# Patient Record
Sex: Female | Born: 1970 | Race: White | Hispanic: No | Marital: Married | Smoking: Current every day smoker
Health system: Southern US, Community
[De-identification: ages and names within clinical notes are randomized; demographics above are authoritative.]

---

## 2011-12-15 ENCOUNTER — Ambulatory Visit: Payer: Self-pay

## 2012-11-05 ENCOUNTER — Ambulatory Visit: Payer: Self-pay | Admitting: Family Medicine

## 2016-03-11 ENCOUNTER — Ambulatory Visit: Payer: Self-pay | Admitting: Internal Medicine

## 2016-03-21 ENCOUNTER — Other Ambulatory Visit: Payer: Self-pay | Admitting: Family Medicine

## 2016-03-21 DIAGNOSIS — N938 Other specified abnormal uterine and vaginal bleeding: Secondary | ICD-10-CM

## 2016-03-26 ENCOUNTER — Ambulatory Visit: Payer: BLUE CROSS/BLUE SHIELD

## 2017-05-06 ENCOUNTER — Ambulatory Visit (INDEPENDENT_AMBULATORY_CARE_PROVIDER_SITE_OTHER): Payer: BLUE CROSS/BLUE SHIELD | Admitting: Podiatry

## 2017-05-06 ENCOUNTER — Encounter: Payer: Self-pay | Admitting: Podiatry

## 2017-05-06 ENCOUNTER — Ambulatory Visit (INDEPENDENT_AMBULATORY_CARE_PROVIDER_SITE_OTHER): Payer: BLUE CROSS/BLUE SHIELD

## 2017-05-06 DIAGNOSIS — B070 Plantar wart: Secondary | ICD-10-CM

## 2017-05-06 DIAGNOSIS — M79671 Pain in right foot: Secondary | ICD-10-CM | POA: Diagnosis not present

## 2017-05-06 NOTE — Progress Notes (Signed)
   Subjective: Patient presents today with burning pain and tenderness on the plantar aspect of the right foot secondary to a lesion beneath the second toe. She admits to picking at the area. She also reports pain on the lateral side of the right foot. Patient states that the pain has been present for several weeks now. Patient denies trauma.  Objective: Physical Exam General: The patient is alert and oriented x3 in no acute distress.  Dermatology: Hyperkeratotic skin lesion noted to the plantar aspect of the right foot approximately 1 cm in diameter. Pinpoint bleeding noted upon debridement. Skin is warm, dry and supple bilateral lower extremities. Negative for open lesions or macerations.  Vascular: Palpable pedal pulses bilaterally. No edema or erythema noted. Capillary refill within normal limits.  Neurological: Epicritic and protective threshold grossly intact bilaterally.   Musculoskeletal Exam: Pain on palpation to the note skin lesion.  Range of motion within normal limits to all pedal and ankle joints bilateral. Muscle strength 5/5 in all groups bilateral.   Radiographic Exam:  Normal osseous mineralization. Joint spaces preserved. No fracture/dislocation/boney destruction.  Assessment: #1 plantar wart right forefoot #2 pain in right foot   Plan of Care:  #1 Patient was evaluated. #2 Excisional debridement of the plantar wart lesion was performed using a chisel blade. Cantharone was applied and the lesion was dressed with a dry sterile dressing. #3 patient is to return to clinic in 2 weeks.     Edrick Kins, DPM [facility]  Dr. Edrick Kins, [address]                                        [place], [place] [address]                Office [phone]  Fax [phone]

## 2017-05-23 ENCOUNTER — Ambulatory Visit (INDEPENDENT_AMBULATORY_CARE_PROVIDER_SITE_OTHER): Payer: BLUE CROSS/BLUE SHIELD | Admitting: Podiatry

## 2017-05-23 ENCOUNTER — Encounter: Payer: Self-pay | Admitting: Podiatry

## 2017-05-23 DIAGNOSIS — B070 Plantar wart: Secondary | ICD-10-CM

## 2017-05-25 NOTE — Progress Notes (Signed)
   Subjective: Patient presents today for follow-up evaluation of a plantar wart to the right forefoot. She states the pain has improved. She only reports pain when applying pressure to the area.  Objective: Physical Exam General: The patient is alert and oriented x3 in no acute distress.  Dermatology: Hyperkeratotic skin lesion noted to the plantar aspect of the right foot approximately 1 cm in diameter. Pinpoint bleeding noted upon debridement. Skin is warm, dry and supple bilateral lower extremities. Negative for open lesions or macerations.  Vascular: Palpable pedal pulses bilaterally. No edema or erythema noted. Capillary refill within normal limits.  Neurological: Epicritic and protective threshold grossly intact bilaterally.   Musculoskeletal Exam: Pain on palpation to the note skin lesion.  Range of motion within normal limits to all pedal and ankle joints bilateral. Muscle strength 5/5 in all groups bilateral.   Assessment: #1 plantar wart right forefoot #2 pain in right foot   Plan of Care:  #1 Patient was evaluated. #2 Excisional debridement of the plantar wart lesion was performed using a chisel blade. Cantharone was applied and the lesion was dressed with a dry sterile dressing. #3 patient is to return to clinic in 2 weeks.     Edrick Kins, DPM [facility]  Dr. Edrick Kins, [address]                                        [place], [place] [address]                Office [phone]  Fax [phone]

## 2017-06-10 ENCOUNTER — Ambulatory Visit: Payer: BLUE CROSS/BLUE SHIELD | Admitting: Podiatry

## 2019-11-08 DIAGNOSIS — J209 Acute bronchitis, unspecified: Secondary | ICD-10-CM | POA: Diagnosis not present

## 2020-03-14 DIAGNOSIS — Z20828 Contact with and (suspected) exposure to other viral communicable diseases: Secondary | ICD-10-CM | POA: Diagnosis not present

## 2020-03-14 DIAGNOSIS — Z03818 Encounter for observation for suspected exposure to other biological agents ruled out: Secondary | ICD-10-CM | POA: Diagnosis not present

## 2020-03-15 DIAGNOSIS — Z20828 Contact with and (suspected) exposure to other viral communicable diseases: Secondary | ICD-10-CM | POA: Diagnosis not present

## 2021-03-06 ENCOUNTER — Other Ambulatory Visit: Payer: Self-pay | Admitting: Internal Medicine

## 2021-03-07 ENCOUNTER — Other Ambulatory Visit: Payer: Self-pay

## 2021-03-07 ENCOUNTER — Ambulatory Visit
Admission: RE | Admit: 2021-03-07 | Discharge: 2021-03-07 | Disposition: A | Discharge status: Home / Self Care | Payer: BC Managed Care – PPO | Source: Ambulatory Visit | Attending: Internal Medicine | Admitting: Internal Medicine

## 2021-03-07 ENCOUNTER — Ambulatory Visit (INDEPENDENT_AMBULATORY_CARE_PROVIDER_SITE_OTHER): Payer: BC Managed Care – PPO | Admitting: Internal Medicine

## 2021-03-07 ENCOUNTER — Encounter: Payer: Self-pay | Admitting: Internal Medicine

## 2021-03-07 ENCOUNTER — Ambulatory Visit
Admission: RE | Admit: 2021-03-07 | Discharge: 2021-03-07 | Disposition: A | Discharge status: Home / Self Care | Payer: BC Managed Care – PPO | Attending: Internal Medicine | Admitting: Internal Medicine

## 2021-03-07 VITALS — BP 120/84 | HR 84 | Temp 98.4°F | Ht 66.75 in | Wt 210.0 lb

## 2021-03-07 DIAGNOSIS — G5601 Carpal tunnel syndrome, right upper limb: Secondary | ICD-10-CM | POA: Diagnosis not present

## 2021-03-07 DIAGNOSIS — M25521 Pain in right elbow: Secondary | ICD-10-CM | POA: Insufficient documentation

## 2021-03-07 DIAGNOSIS — M542 Cervicalgia: Secondary | ICD-10-CM | POA: Diagnosis not present

## 2021-03-07 DIAGNOSIS — M5412 Radiculopathy, cervical region: Secondary | ICD-10-CM

## 2021-03-07 MED ORDER — CYCLOBENZAPRINE HCL 10 MG PO TABS
10.0000 mg | ORAL_TABLET | Freq: Three times a day (TID) | ORAL | 0 refills | Status: DC | PRN
Start: 2021-03-07 — End: 2022-04-12

## 2021-03-07 MED ORDER — MELOXICAM 15 MG PO TABS: 15.0000 mg | ORAL_TABLET | Freq: Every day | ORAL | 0 refills | Status: DC

## 2021-03-07 MED ORDER — PREDNISONE 10 MG PO TABS: ORAL_TABLET | ORAL | 0 refills | Status: AC

## 2021-03-07 NOTE — Patient Instructions (Signed)
Thermacare heat wraps  When you finish the prednisone, start the Meloxicam  Muscle relaxant after work/before bed and when you are not working

## 2021-03-07 NOTE — Progress Notes (Signed)
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 The pain is present in the upper right arm. The quality of the pain is described as burning (numbness with overhead movement and excessive use). The pain is moderate. The pain has been fluctuating since the incident. Associated symptoms include muscle weakness, numbness and tingling. Pertinent negatives include no chest pain. The symptoms are aggravated by movement and lifting. She has tried NSAIDs and heat for the symptoms. The treatment provided mild relief.   Elbow pain - unable to straighten right elbow completely without pain.  No trauma but works overtime as a Emergency planning/management officer.  Tingling in hand - often wakes her from sleep and gets numb with use.  Some loss of grip.    No results found for: CREATININE, BUN, NA, K, CL, CO2 No results found for: CHOL, HDL, LDLCALC, LDLDIRECT, TRIG, CHOLHDL No results found for: TSH No results found for: HGBA1C No results found for: WBC, HGB, HCT, MCV, PLT No results found for: ALT, AST, GGT, ALKPHOS, BILITOT   Review of Systems  Constitutional: Negative for chills, fatigue and fever.  Respiratory: Negative for cough, chest tightness, shortness of breath and wheezing.   Cardiovascular: Negative for chest pain and palpitations.  Genitourinary: Positive for menstrual problem (irregular menses). Negative for dyspareunia and urgency.  Musculoskeletal: Positive for arthralgias and myalgias. Negative for gait problem and joint swelling.  Neurological: Positive for tingling and numbness.    There are no problems to display for this patient.   No Known Allergies  History reviewed. No pertinent surgical history.  Social  History   Tobacco Use  . Smoking status: Current Every Day Smoker    Packs/day: 0.50    Years: 35.00    Pack years: 17.50  . Smokeless tobacco: Never Used  Vaping Use  . Vaping Use: Never used  Substance Use Topics  . Alcohol use: Yes    Comment: occasional  . Drug use: Never     Medication list has been reviewed and updated.  No outpatient medications have been marked as taking for the 03/07/21 encounter (Office Visit) with Glean Hess, MD.    [facility] 2/9 Scores 03/07/2021  PHQ - 2 Score 0  PHQ- 9 Score 2    GAD 7 : Generalized Anxiety Score 03/07/2021  Nervous, Anxious, on Edge 0  Control/stop worrying 0  Worry too much - different things 0  Trouble relaxing 0  Restless 0  Easily annoyed or irritable 1  Afraid - awful might happen 0  Total GAD 7 Score 1    BP Readings from Last 3 Encounters:  03/07/21 120/84    Physical Exam Vitals and nursing note reviewed.  Constitutional:      General: She is not in acute distress.    Appearance: She is well-developed.  HENT:     Head: Normocephalic and atraumatic.  Cardiovascular:     Rate and Rhythm: Normal rate and regular rhythm.     Pulses: Normal pulses.     Heart sounds: No murmur heard.   Pulmonary:     Effort: Pulmonary effort is normal. No respiratory distress.  Musculoskeletal:     Right shoulder: Normal.     Left shoulder: Normal.     Right elbow: Decreased range of motion. Tenderness present in lateral epicondyle.     Right wrist: Tenderness present. No crepitus. Normal range of motion.     Left wrist: No tenderness or crepitus. Normal range of motion.     Cervical back: Spasms and tenderness present. Decreased range of motion.     Right lower leg: No edema.     Left lower leg: No edema.  Lymphadenopathy:     Cervical: No cervical adenopathy.  Skin:    General: Skin is warm and dry.     Findings: No rash.  Neurological:     Mental Status: She is alert and oriented to person, place, and time.   Psychiatric:        Mood and Affect: Mood normal.        Behavior: Behavior normal.     Wt Readings from Last 3 Encounters:  03/07/21 210 lb (95.3 kg)    BP 120/84   Pulse 84   Temp 98.4 F (36.9 C) (Oral)   Ht 5' 6.75" (1.695 m)   Wt 210 lb (95.3 kg)   LMP 02/13/2021 (Approximate)   SpO2 99%   BMI 33.14 kg/m   Assessment and Plan: 1. Cervical radiculopathy Will get films; begin steroid taper followed by Mobic along with flexeril Consider Thermacare heat wraps Call in 10 days to report response - DG Cervical Spine Complete; Future - cyclobenzaprine (FLEXERIL) 10 MG tablet; Take 1 tablet (10 mg total) by mouth 3 (three) times daily as needed for muscle spasms.  Dispense: 90 tablet; Refill: 0 - meloxicam (MOBIC) 15 MG tablet; Take 1 tablet (15 mg total) by mouth daily.  Dispense: 90 tablet; Refill: 0  2. Right elbow pain Due to overuse; should respond to prednisone - predniSONE (DELTASONE) 10 MG tablet; Take 6 on day 1and 2, 5 on day 3 and 4, 4 on day 5 and 6 , 3 on day 7 and 8, 2 on day 9 and 10 and 1 on day 11 and 12 then stop.  Dispense: 42 tablet; Refill: 0  3. Carpal tunnel syndrome of right wrist Monitor for worsening   Partially dictated using Dragon software. Any errors are unintentional.  Halina Maidens, MD [facility]  03/07/2021

## 2021-03-12 ENCOUNTER — Telehealth: Payer: Self-pay

## 2021-03-12 NOTE — Telephone Encounter (Signed)
Called and left VM for patient to call back and discuss her questions. Also, asked if she is still taking prednisone and asked how she is doing.  CM

## 2021-03-12 NOTE — Telephone Encounter (Signed)
Patient has returned call to practice Patient disclosed that they are still taking the prednisone, and are experiencing no side effects from the medication Please contact again when possible

## 2021-03-12 NOTE — Telephone Encounter (Unsigned)
Copied from [place] [id number]. Topic: General - Other >> Mar 12, 2021  9:14 AM Tessa Lerner A wrote: Reason for CRM: Patient would like to be contacted by a member of staff to discuss xray results further  Please contact to advise

## 2021-03-13 NOTE — Telephone Encounter (Signed)
Called patient and spoke with her and she wanted to clarify what was on her xray. Told her due to mer muscle spasms of her neck she does have narrowing at the cervical spine and minimal arthritic changes. She said she read on my chart that there is bone on bone in her neck. Spoke with Dr. Army Melia to confirm and told patient there is no bone on bone. She verbalized understanding. She said she feels like prednisone is helping some. 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

## 2021-03-20 ENCOUNTER — Encounter: Payer: Self-pay | Admitting: Internal Medicine

## 2021-06-03 ENCOUNTER — Other Ambulatory Visit: Payer: Self-pay | Admitting: Internal Medicine

## 2021-06-03 DIAGNOSIS — M5412 Radiculopathy, cervical region: Secondary | ICD-10-CM

## 2021-06-03 NOTE — Telephone Encounter (Signed)
Requested Prescriptions  Pending Prescriptions Disp Refills  . meloxicam (MOBIC) 15 MG tablet [Pharmacy Med Name: MELOXICAM 15MG  TABLETS] 90 tablet 0    Sig: TAKE 1 TABLET(15 MG) BY MOUTH DAILY     Analgesics:  COX2 Inhibitors Failed - 06/03/2021  3:17 AM      Failed - HGB in normal range and within 360 days    No results found for: HGB, HGBKUC, HGBPOCKUC, HGBOTHER, TOTHGB, HGBPLASMA       Failed - Cr in normal range and within 360 days    No results found for: CREATININE, LABCREAU, [place], [place] - Patient is not pregnant      Passed - Valid encounter within last 12 months    Recent Outpatient Visits          2 months ago Cervical radiculopathy   [facility] Glean Hess, MD      Future Appointments            In 1 month Army Melia, Jesse Sans, MD [facility], [facility]

## 2021-07-13 ENCOUNTER — Encounter: Payer: BC Managed Care – PPO | Admitting: Internal Medicine

## 2021-07-18 ENCOUNTER — Encounter: Payer: Self-pay | Admitting: Internal Medicine

## 2021-07-18 ENCOUNTER — Other Ambulatory Visit (HOSPITAL_COMMUNITY)
Admission: RE | Admit: 2021-07-18 | Discharge: 2021-07-18 | Disposition: A | Discharge status: Home / Self Care | Payer: BC Managed Care – PPO | Source: Ambulatory Visit | Attending: Internal Medicine | Admitting: Internal Medicine

## 2021-07-18 ENCOUNTER — Ambulatory Visit (INDEPENDENT_AMBULATORY_CARE_PROVIDER_SITE_OTHER): Payer: BC Managed Care – PPO | Admitting: Internal Medicine

## 2021-07-18 ENCOUNTER — Other Ambulatory Visit: Payer: Self-pay

## 2021-07-18 VITALS — BP 112/78 | HR 65 | Ht 66.75 in | Wt 199.0 lb

## 2021-07-18 DIAGNOSIS — Z1211 Encounter for screening for malignant neoplasm of colon: Secondary | ICD-10-CM

## 2021-07-18 DIAGNOSIS — Z124 Encounter for screening for malignant neoplasm of cervix: Secondary | ICD-10-CM | POA: Diagnosis not present

## 2021-07-18 DIAGNOSIS — Z0000 Encounter for general adult medical examination without abnormal findings: Secondary | ICD-10-CM

## 2021-07-18 DIAGNOSIS — Z1231 Encounter for screening mammogram for malignant neoplasm of breast: Secondary | ICD-10-CM

## 2021-07-18 NOTE — Progress Notes (Signed)
Date:  07/18/2021   Name:  Jessica Robles   DOB:  1971-06-10   MRN:  [id number]   Chief Complaint: Annual Exam (Breast Exam. Pap smear.) Jessica Robles is a 50 y.o. female who presents today for her Complete Annual Exam. She feels well. She reports exercising - walking daily. She reports she is sleeping fairly well. Breast complaints - none.  Mammogram: due DEXA: none Pap smear: due - last about 2015 normal/remote hx of LEEP Colonoscopy: due   There is no immunization history on file for this patient.  HPI  No results found for: CREATININE, BUN, NA, K, CL, CO2 No results found for: CHOL, HDL, LDLCALC, LDLDIRECT, TRIG, CHOLHDL No results found for: TSH No results found for: HGBA1C No results found for: WBC, HGB, HCT, MCV, PLT No results found for: ALT, AST, GGT, ALKPHOS, BILITOT   Review of Systems  Constitutional:  Negative for chills, fatigue and fever.  HENT:  Negative for congestion, hearing loss, tinnitus, trouble swallowing and voice change.   Eyes:  Negative for visual disturbance.  Respiratory:  Negative for cough, chest tightness, shortness of breath and wheezing.   Cardiovascular:  Negative for chest pain, palpitations and leg swelling.  Gastrointestinal:  Negative for abdominal pain, constipation, diarrhea and vomiting.  Endocrine: Negative for polydipsia and polyuria.  Genitourinary:  Negative for dysuria, frequency, genital sores, vaginal bleeding and vaginal discharge.  Musculoskeletal:  Negative for arthralgias, gait problem and joint swelling.  Skin:  Negative for color change and rash.  Neurological:  Negative for dizziness, tremors, light-headedness and headaches.  Hematological:  Negative for adenopathy. Does not bruise/bleed easily.  Psychiatric/Behavioral:  Negative for dysphoric mood and sleep disturbance. The patient is not nervous/anxious.    Patient Active Problem List   Diagnosis Date Noted   Cervical radiculopathy 03/07/2021   Right elbow pain  03/07/2021    No Known Allergies  History reviewed. No pertinent surgical history.  Social History   Tobacco Use   Smoking status: Every Day    Packs/day: 0.50    Years: 36.00    Pack years: 18.00    Types: Cigarettes   Smokeless tobacco: Never  Vaping Use   Vaping Use: Never used  Substance Use Topics   Alcohol use: Yes    Comment: occasional   Drug use: Never     Medication list has been reviewed and updated.  Current Meds  Medication Sig   cyclobenzaprine (FLEXERIL) 10 MG tablet Take 1 tablet (10 mg total) by mouth 3 (three) times daily as needed for muscle spasms.   meloxicam (MOBIC) 15 MG tablet TAKE 1 TABLET(15 MG) BY MOUTH DAILY    PHQ 2/9 Scores 07/18/2021 03/07/2021  PHQ - 2 Score 0 0  PHQ- 9 Score 2 2    GAD 7 : Generalized Anxiety Score 07/18/2021 03/07/2021  Nervous, Anxious, on Edge 0 0  Control/stop worrying 0 0  Worry too much - different things 0 0  Trouble relaxing 0 0  Restless 0 0  Easily annoyed or irritable 1 1  Afraid - awful might happen 0 0  Total GAD 7 Score 1 1  Anxiety Difficulty Not difficult at all -    BP Readings from Last 3 Encounters:  07/18/21 112/78  03/07/21 120/84    Physical Exam Vitals and nursing note reviewed.  Constitutional:      General: She is not in acute distress.    Appearance: She is well-developed.  HENT:  Head: Normocephalic and atraumatic.     Right Ear: Tympanic membrane and ear canal normal.     Left Ear: Tympanic membrane and ear canal normal.     Nose:     Right Sinus: No maxillary sinus tenderness.     Left Sinus: No maxillary sinus tenderness.  Eyes:     General: No scleral icterus.       Right eye: No discharge.        Left eye: No discharge.     Conjunctiva/sclera: Conjunctivae normal.  Neck:     Thyroid: No thyromegaly.     Vascular: No carotid bruit.  Cardiovascular:     Rate and Rhythm: Normal rate and regular rhythm.     Pulses: Normal pulses.     Heart sounds: Normal heart  sounds.  Pulmonary:     Effort: Pulmonary effort is normal. No respiratory distress.     Breath sounds: No wheezing.  Chest:  Breasts:    Right: No mass, nipple discharge, skin change or tenderness.     Left: No mass, nipple discharge, skin change or tenderness.  Abdominal:     General: Bowel sounds are normal.     Palpations: Abdomen is soft.     Tenderness: There is no abdominal tenderness.  Genitourinary:    Labia:        Right: No tenderness, lesion or injury.        Left: No tenderness, lesion or injury.      Vagina: Normal.     Cervix: Normal.     Uterus: Normal.      Adnexa: Right adnexa normal and left adnexa normal.  Musculoskeletal:     Cervical back: Normal range of motion. No erythema.     Right lower leg: No edema.     Left lower leg: No edema.  Lymphadenopathy:     Cervical: No cervical adenopathy.  Skin:    General: Skin is warm and dry.     Findings: No rash.  Neurological:     Mental Status: She is alert and oriented to person, place, and time.     Cranial Nerves: No cranial nerve deficit.     Sensory: No sensory deficit.     Deep Tendon Reflexes: Reflexes are normal and symmetric.  Psychiatric:        Attention and Perception: Attention normal.        Mood and Affect: Mood normal.    Wt Readings from Last 3 Encounters:  07/18/21 199 lb (90.3 kg)  03/07/21 210 lb (95.3 kg)    BP 112/78 (BP Location: Right Arm, Patient Position: Sitting, Cuff Size: Normal)   Pulse 65   Ht 5' 6.75" (1.695 m)   Wt 199 lb (90.3 kg)   LMP 06/15/2021 (Exact Date)   SpO2 97%   BMI 31.40 kg/m   Assessment and Plan: 1. Annual physical exam Exam is normal except for weight. She is losing weight with regular exercise and appropriate dietary changes. - TSH - Lipid panel - Comprehensive metabolic panel - CBC with Differential/Platelet  2. Colon cancer screening - Ambulatory referral to Gastroenterology  3. Encounter for screening for cervical cancer Obtained  today - Cytology - PAP  4. Encounter for screening mammogram for breast cancer At [facility]   Partially dictated using Editor, commissioning. Any errors are unintentional.  Jessica Maidens, MD [facility]  07/18/2021

## 2021-07-19 LAB — CBC WITH DIFFERENTIAL/PLATELET
Basophils Absolute: 0.1 10*3/uL (ref 0.0–0.2)
Basos: 1 %
EOS (ABSOLUTE): 0.2 10*3/uL (ref 0.0–0.4)
Eos: 2 %
Hematocrit: 46 % (ref 34.0–46.6)
Hemoglobin: 15.5 g/dL (ref 11.1–15.9)
Immature Grans (Abs): 0 10*3/uL (ref 0.0–0.1)
Immature Granulocytes: 0 %
Lymphocytes Absolute: 1.8 10*3/uL (ref 0.7–3.1)
Lymphs: 26 %
MCH: 30.7 pg (ref 26.6–33.0)
MCHC: 33.7 g/dL (ref 31.5–35.7)
MCV: 91 fL (ref 79–97)
Monocytes Absolute: 0.6 10*3/uL (ref 0.1–0.9)
Monocytes: 8 %
Neutrophils Absolute: 4.2 10*3/uL (ref 1.4–7.0)
Neutrophils: 63 %
Platelets: 303 10*3/uL (ref 150–450)
RBC: 5.05 x10E6/uL (ref 3.77–5.28)
RDW: 12.9 % (ref 11.7–15.4)
WBC: 6.8 10*3/uL (ref 3.4–10.8)

## 2021-07-19 LAB — LIPID PANEL
Chol/HDL Ratio: 3.9 ratio (ref 0.0–4.4)
Cholesterol, Total: 194 mg/dL (ref 100–199)
HDL: 50 mg/dL (ref >39)
LDL Chol Calc (NIH): 129 mg/dL — ABNORMAL HIGH (ref 0–99)
Triglycerides: 84 mg/dL (ref 0–149)
VLDL Cholesterol Cal: 15 mg/dL (ref 5–40)

## 2021-07-19 LAB — COMPREHENSIVE METABOLIC PANEL
ALT: 16 IU/L (ref 0–32)
AST: 19 IU/L (ref 0–40)
Albumin/Globulin Ratio: 1.9 (ref 1.2–2.2)
Albumin: 4.5 g/dL (ref 3.8–4.8)
Alkaline Phosphatase: 75 IU/L (ref 44–121)
BUN/Creatinine Ratio: 33 — ABNORMAL HIGH (ref 9–23)
BUN: 24 mg/dL (ref 6–24)
Bilirubin Total: 0.3 mg/dL (ref 0.0–1.2)
CO2: 22 mmol/L (ref 20–29)
Calcium: 9.8 mg/dL (ref 8.7–10.2)
Chloride: 104 mmol/L (ref 96–106)
Creatinine, Ser: 0.73 mg/dL (ref 0.57–1.00)
Globulin, Total: 2.4 g/dL (ref 1.5–4.5)
Glucose: 114 mg/dL — ABNORMAL HIGH (ref 65–99)
Potassium: 5 mmol/L (ref 3.5–5.2)
Sodium: 143 mmol/L (ref 134–144)
Total Protein: 6.9 g/dL (ref 6.0–8.5)
eGFR: 100 mL/min/{1.73_m2} (ref >59)

## 2021-07-19 LAB — TSH: TSH: 1.71 u[IU]/mL (ref 0.450–4.500)

## 2021-07-19 LAB — CYTOLOGY - PAP
Comment: NEGATIVE
Diagnosis: NEGATIVE
High risk HPV: NEGATIVE

## 2021-07-23 ENCOUNTER — Other Ambulatory Visit: Payer: Self-pay

## 2021-07-23 MED ORDER — NA SULFATE-K SULFATE-MG SULF 17.5-3.13-1.6 GM/177ML PO SOLN: 1.0000 | ORAL | 0 refills | Status: DC

## 2021-07-31 ENCOUNTER — Inpatient Hospital Stay: Admission: RE | Admit: 2021-07-31 | Payer: BC Managed Care – PPO | Source: Ambulatory Visit

## 2021-08-15 ENCOUNTER — Other Ambulatory Visit: Payer: Self-pay | Admitting: Internal Medicine

## 2021-08-15 DIAGNOSIS — M5412 Radiculopathy, cervical region: Secondary | ICD-10-CM

## 2021-08-17 ENCOUNTER — Encounter
Admission: RE | Disposition: A | Discharge status: Home / Self Care | Payer: Self-pay | Source: Home / Self Care | Attending: Gastroenterology

## 2021-08-17 ENCOUNTER — Encounter: Payer: Self-pay | Admitting: Gastroenterology

## 2021-08-17 ENCOUNTER — Ambulatory Visit: Payer: BC Managed Care – PPO | Admitting: Certified Registered Nurse Anesthetist

## 2021-08-17 ENCOUNTER — Ambulatory Visit
Admission: RE | Admit: 2021-08-17 | Discharge: 2021-08-17 | Disposition: A | Discharge status: Home / Self Care | Payer: BC Managed Care – PPO | Attending: Gastroenterology | Admitting: Gastroenterology

## 2021-08-17 DIAGNOSIS — D124 Benign neoplasm of descending colon: Secondary | ICD-10-CM | POA: Diagnosis not present

## 2021-08-17 DIAGNOSIS — Z791 Long term (current) use of non-steroidal anti-inflammatories (NSAID): Secondary | ICD-10-CM | POA: Diagnosis not present

## 2021-08-17 DIAGNOSIS — Z1211 Encounter for screening for malignant neoplasm of colon: Secondary | ICD-10-CM

## 2021-08-17 DIAGNOSIS — F17210 Nicotine dependence, cigarettes, uncomplicated: Secondary | ICD-10-CM | POA: Insufficient documentation

## 2021-08-17 DIAGNOSIS — D122 Benign neoplasm of ascending colon: Secondary | ICD-10-CM | POA: Diagnosis not present

## 2021-08-17 DIAGNOSIS — K635 Polyp of colon: Secondary | ICD-10-CM

## 2021-08-17 DIAGNOSIS — Z800 Family history of malignant neoplasm of digestive organs: Secondary | ICD-10-CM | POA: Diagnosis not present

## 2021-08-17 HISTORY — PX: COLONOSCOPY: SHX5424

## 2021-08-17 LAB — POCT PREGNANCY, URINE: Preg Test, Ur: NEGATIVE

## 2021-08-17 SURGERY — COLONOSCOPY
Anesthesia: General

## 2021-08-17 MED ORDER — LIDOCAINE HCL (PF) 2 % IJ SOLN
INTRAMUSCULAR | Status: AC
  Filled 2021-08-17: qty 5

## 2021-08-17 MED ORDER — LIDOCAINE HCL (CARDIAC) PF 100 MG/5ML IV SOSY
PREFILLED_SYRINGE | INTRAVENOUS | Status: DC | PRN
  Administered 2021-08-17: 50 mg via INTRAVENOUS

## 2021-08-17 MED ORDER — SODIUM CHLORIDE 0.9 % IV SOLN
INTRAVENOUS | Status: DC
  Administered 2021-08-17: 20 mL/h via INTRAVENOUS

## 2021-08-17 MED ORDER — PROPOFOL 10 MG/ML IV BOLUS
INTRAVENOUS | Status: DC | PRN
  Administered 2021-08-17: 30 mg via INTRAVENOUS
  Administered 2021-08-17: 70 mg via INTRAVENOUS

## 2021-08-17 MED ORDER — PROPOFOL 500 MG/50ML IV EMUL
INTRAVENOUS | Status: DC | PRN
  Administered 2021-08-17: 170 ug/kg/min via INTRAVENOUS

## 2021-08-17 MED ORDER — PROPOFOL 10 MG/ML IV BOLUS
INTRAVENOUS | Status: AC
  Filled 2021-08-17: qty 20

## 2021-08-17 NOTE — H&P (Signed)
Jessica Bellows, MD [address] [place], [place], [place], [address] [address], [place], [place], [place], [address] Phone: [phone]  Fax: [phone]  Primary Care Physician:  Jessica Hess, MD   Pre-Procedure History & Physical: HPI:  Jessica Robles is a 50 y.o. female is here for an colonoscopy.   History reviewed. No pertinent past medical history.  History reviewed. No pertinent surgical history.  Prior to Admission medications   Medication Sig Start Date End Date Taking? Authorizing Provider  cyclobenzaprine (FLEXERIL) 10 MG tablet Take 1 tablet (10 mg total) by mouth 3 (three) times daily as needed for muscle spasms. 03/07/21  Yes Jessica Hess, MD  meloxicam (MOBIC) 15 MG tablet TAKE 1 TABLET(15 MG) BY MOUTH DAILY 06/03/21  Yes Jessica Hess, MD  Na Sulfate-K Sulfate-Mg Sulf (SUPREP BOWEL PREP KIT) 17.5-3.13-1.6 GM/177ML SOLN Take 1 kit by mouth as directed. 07/23/21  Yes Jessica Bellows, MD    Allergies as of 07/23/2021   (No Known Allergies)    Family History  Problem Relation Age of Onset   Cancer Mother        gall bladder with mets   Hypertension Father    Esophageal cancer Sister    Diabetes Maternal Grandmother    Heart disease Maternal Grandmother    Stroke Maternal Grandmother    Cancer Maternal Grandfather     Social History   Socioeconomic History   Marital status: Married    Spouse name: Not on file   Number of children: 2   Years of education: Not on file   Highest education level: Not on file  Occupational History   Not on file  Tobacco Use   Smoking status: Every Day    Packs/day: 0.50    Years: 36.00    Pack years: 18.00    Types: Cigarettes   Smokeless tobacco: Never  Vaping Use   Vaping Use: Never used  Substance and Sexual Activity   Alcohol use: Yes    Comment: occasional   Drug use: Never   Sexual activity: Yes  Other Topics Concern   Not on file  Social History Narrative   Not on file   Social  Determinants of Health   Financial Resource Strain: Not on file  Food Insecurity: Not on file  Transportation Needs: Not on file  Physical Activity: Not on file  Stress: Not on file  Social Connections: Not on file  Intimate Partner Violence: Not on file    Review of Systems: See HPI, otherwise negative ROS  Physical Exam: BP 115/73   Pulse 78   Temp (!) 97.1 F (36.2 C)   Resp 20   Ht _0  (1.676 m)   Wt 88.9 kg   SpO2 99%   BMI 31.64 kg/m  General:   Alert,  pleasant and cooperative in NAD Head:  Normocephalic and atraumatic. Neck:  Supple; no masses or thyromegaly. Lungs:  Clear throughout to auscultation, normal respiratory effort.    Heart:  +S1, +S2, Regular rate and rhythm, No edema. Abdomen:  Soft, nontender and nondistended. Normal bowel sounds, without guarding, and without rebound.   Neurologic:  Alert and  oriented x4;  grossly normal neurologically.  Impression/Plan: Jessica Robles is here for an colonoscopy to be performed for Screening colonoscopy average risk   Risks, benefits, limitations, and alternatives regarding  colonoscopy have been reviewed with the patient.  Questions have been answered.  All parties agreeable.   Jessica Robles  Jessica Males, MD  08/17/2021, 10:11 AM

## 2021-08-17 NOTE — Transfer of Care (Signed)
Immediate Anesthesia Transfer of Care Note  Patient: Jessica Robles  Procedure(s) Performed: COLONOSCOPY  Patient Location: Endoscopy Unit  Anesthesia Type:General  Level of Consciousness: awake, alert  and oriented  Airway & Oxygen Therapy: Patient Spontanous Breathing  Post-op Assessment: Report given to RN and Post -op Vital signs reviewed and stable  Post vital signs: Reviewed and stable  Last Vitals:  Vitals Value Taken Time  BP 112/53 08/17/21 1046  Temp 36.4 C 08/17/21 1044  Pulse 81 08/17/21 1046  Resp 19 08/17/21 1046  SpO2 100 % 08/17/21 1046  Vitals shown include unvalidated device data.  Last Pain:  Vitals:   08/17/21 1044  TempSrc: Temporal  PainSc: 0-No pain         Complications: No notable events documented.

## 2021-08-17 NOTE — Anesthesia Preprocedure Evaluation (Signed)
Anesthesia Evaluation  Patient identified by MRN, date of birth, ID band Patient awake    Reviewed: Allergy & Precautions, NPO status , Patient's Chart, lab work & pertinent test results  History of Anesthesia Complications Negative for: history of anesthetic complications  Airway Mallampati: III  TM Distance: >3 FB Neck ROM: full    Dental  (+) Chipped   Pulmonary neg shortness of breath, Current Smoker and Patient abstained from smoking.,    Pulmonary exam normal        Cardiovascular Exercise Tolerance: Good (-) Past MI negative cardio ROS Normal cardiovascular exam     Neuro/Psych  Neuromuscular disease negative psych ROS   GI/Hepatic negative GI ROS, Neg liver ROS, neg GERD  ,  Endo/Other  negative endocrine ROS  Renal/GU negative Renal ROS  negative genitourinary   Musculoskeletal   Abdominal   Peds  Hematology negative hematology ROS (+)   Anesthesia Other Findings  BMI    Body Mass Index: 31.64 kg/m      Reproductive/Obstetrics negative OB ROS                             Anesthesia Physical Anesthesia Plan  ASA: 2  Anesthesia Plan: General   Post-op Pain Management:    Induction: Intravenous  PONV Risk Score and Plan: Propofol infusion and TIVA  Airway Management Planned: Natural Airway and Nasal Cannula  Additional Equipment:   Intra-op Plan:   Post-operative Plan:   Informed Consent: I have reviewed the patients History and Physical, chart, labs and discussed the procedure including the risks, benefits and alternatives for the proposed anesthesia with the patient or authorized representative who has indicated his/her understanding and acceptance.     Dental Advisory Given  Plan Discussed with: Anesthesiologist, CRNA and Surgeon  Anesthesia Plan Comments: (Patient consented for risks of anesthesia including but not limited to:  - adverse reactions to  medications - risk of airway placement if required - damage to eyes, teeth, lips or other oral mucosa - nerve damage due to positioning  - sore throat or hoarseness - Damage to heart, brain, nerves, lungs, other parts of body or loss of life  Patient voiced understanding.)        Anesthesia Quick Evaluation

## 2021-08-17 NOTE — Op Note (Signed)
[facility] Gastroenterology Patient Name: Jessica Robles Procedure Date: 08/17/2021 10:12 AM MRN: [id number] Account #: [id number] Date of Birth: 08/02/1971 Admit Type: Outpatient Age: 50 Room: [facility] ENDO ROOM 4 Gender: Female Note Status: Finalized Instrument Name: Park Meo [id number] Procedure:             Colonoscopy Indications:           Screening for colorectal malignant neoplasm Providers:             Jonathon Bellows MD, MD Referring MD:          Halina Maidens, MD (Referring MD) Medicines:             Monitored Anesthesia Care Complications:         No immediate complications. Procedure:             Pre-Anesthesia Assessment:                        - Prior to the procedure, a History and Physical was                         performed, and patient medications, allergies and                         sensitivities were reviewed. The patient's tolerance                         of previous anesthesia was reviewed.                        - The risks and benefits of the procedure and the                         sedation options and risks were discussed with the                         patient. All questions were answered and informed                         consent was obtained.                        - ASA Grade Assessment: II - A patient with mild                         systemic disease.                        After obtaining informed consent, the colonoscope was                         passed under direct vision. Throughout the procedure,                         the patient's blood pressure, pulse, and oxygen                         saturations were monitored continuously. The                         Colonoscope was introduced  through the anus and                         advanced to the the cecum, identified by the                         appendiceal orifice. The colonoscopy was performed                         with ease. The patient tolerated the procedure well.                          The quality of the bowel preparation was good. Findings:      The perianal and digital rectal examinations were normal.      A 10 mm polyp was found in the ascending colon. The polyp was sessile.       The polyp was removed with a cold snare. Resection and retrieval were       complete.      A 7 mm polyp was found in the descending colon. The polyp was sessile.       The polyp was removed with a cold snare. Resection and retrieval were       complete.      The exam was otherwise without abnormality on direct and retroflexion       views. Impression:            - One 10 mm polyp in the ascending colon, removed with                         a cold snare. Resected and retrieved.                        - One 7 mm polyp in the descending colon, removed with                         a cold snare. Resected and retrieved.                        - The examination was otherwise normal on direct and                         retroflexion views. Recommendation:        - Discharge patient to home (with escort).                        - Resume previous diet.                        - Continue present medications.                        - Await pathology results.                        - Repeat colonoscopy for surveillance based on                         pathology results. Procedure Code(s):     --- Professional ---  45385, Colonoscopy, flexible; with removal of                         tumor(s), polyp(s), or other lesion(s) by snare                         technique Diagnosis Code(s):     --- Professional ---                        Z12.11, Encounter for screening for malignant neoplasm                         of colon                        K63.5, Polyp of colon CPT copyright 2019 American Medical Association. All rights reserved. The codes documented in this report are preliminary and upon coder review may  be revised to meet current compliance  requirements. Jonathon Bellows, MD Jonathon Bellows MD, MD 08/17/2021 10:42:17 AM This report has been signed electronically. Number of Addenda: 0 Note Initiated On: 08/17/2021 10:12 AM Scope Withdrawal Time: 0 hours 12 minutes 23 seconds  Total Procedure Duration: 0 hours 17 minutes 11 seconds  Estimated Blood Loss:  Estimated blood loss: none.      [facility]

## 2021-08-21 LAB — SURGICAL PATHOLOGY

## 2021-08-21 NOTE — Anesthesia Postprocedure Evaluation (Signed)
Anesthesia Post Note  Patient: Jessica Robles  Procedure(s) Performed: COLONOSCOPY  Patient location during evaluation: Endoscopy Anesthesia Type: General Level of consciousness: awake and alert Pain management: pain level controlled Vital Signs Assessment: post-procedure vital signs reviewed and stable Respiratory status: spontaneous breathing, nonlabored ventilation, respiratory function stable and patient connected to nasal cannula oxygen Cardiovascular status: blood pressure returned to baseline and stable Postop Assessment: no apparent nausea or vomiting Anesthetic complications: no   No notable events documented.   Last Vitals:  Vitals:   08/17/21 1044 08/17/21 1104  BP: (!) 112/53 128/62  Pulse: 80   Resp: 18   Temp: (!) 36.4 C   SpO2: 100%     Last Pain:  Vitals:   08/18/21 1054  TempSrc:   PainSc: 0-No pain                 Precious Haws Jarnell Cordaro

## 2021-08-28 ENCOUNTER — Encounter: Payer: Self-pay | Admitting: Gastroenterology

## 2021-08-28 NOTE — Progress Notes (Signed)
Ok

## 2021-08-30 ENCOUNTER — Encounter: Payer: Self-pay | Admitting: Internal Medicine

## 2021-09-10 ENCOUNTER — Other Ambulatory Visit: Payer: Self-pay

## 2021-09-10 ENCOUNTER — Ambulatory Visit
Admission: RE | Admit: 2021-09-10 | Discharge: 2021-09-10 | Disposition: A | Discharge status: Home / Self Care | Payer: BC Managed Care – PPO | Source: Ambulatory Visit | Attending: Internal Medicine | Admitting: Internal Medicine

## 2021-09-10 DIAGNOSIS — Z1231 Encounter for screening mammogram for malignant neoplasm of breast: Secondary | ICD-10-CM | POA: Diagnosis not present

## 2021-09-13 DIAGNOSIS — S39012A Strain of muscle, fascia and tendon of lower back, initial encounter: Secondary | ICD-10-CM | POA: Diagnosis not present

## 2021-09-13 DIAGNOSIS — M545 Low back pain, unspecified: Secondary | ICD-10-CM | POA: Diagnosis not present

## 2021-09-17 ENCOUNTER — Other Ambulatory Visit: Payer: Self-pay | Admitting: Internal Medicine

## 2021-09-17 ENCOUNTER — Encounter: Payer: Self-pay | Admitting: Internal Medicine

## 2021-09-17 DIAGNOSIS — R928 Other abnormal and inconclusive findings on diagnostic imaging of breast: Secondary | ICD-10-CM

## 2021-09-17 DIAGNOSIS — N6489 Other specified disorders of breast: Secondary | ICD-10-CM

## 2021-09-17 DIAGNOSIS — N6310 Unspecified lump in the right breast, unspecified quadrant: Secondary | ICD-10-CM

## 2021-09-18 NOTE — Progress Notes (Signed)
Spoke to pt she has spoke to the [facility].   KP

## 2021-09-24 ENCOUNTER — Ambulatory Visit
Admission: RE | Admit: 2021-09-24 | Discharge: 2021-09-24 | Disposition: A | Discharge status: Home / Self Care | Payer: BC Managed Care – PPO | Source: Ambulatory Visit | Attending: Internal Medicine | Admitting: Internal Medicine

## 2021-09-24 ENCOUNTER — Other Ambulatory Visit: Payer: Self-pay

## 2021-09-24 DIAGNOSIS — N6489 Other specified disorders of breast: Secondary | ICD-10-CM

## 2021-09-24 DIAGNOSIS — N6310 Unspecified lump in the right breast, unspecified quadrant: Secondary | ICD-10-CM

## 2021-09-24 DIAGNOSIS — R928 Other abnormal and inconclusive findings on diagnostic imaging of breast: Secondary | ICD-10-CM

## 2021-09-24 DIAGNOSIS — R922 Inconclusive mammogram: Secondary | ICD-10-CM | POA: Diagnosis not present

## 2021-09-25 ENCOUNTER — Other Ambulatory Visit: Payer: Self-pay | Admitting: Internal Medicine

## 2021-09-25 DIAGNOSIS — R928 Other abnormal and inconclusive findings on diagnostic imaging of breast: Secondary | ICD-10-CM

## 2021-09-25 DIAGNOSIS — N6310 Unspecified lump in the right breast, unspecified quadrant: Secondary | ICD-10-CM

## 2021-10-04 ENCOUNTER — Ambulatory Visit
Admission: RE | Admit: 2021-10-04 | Discharge: 2021-10-04 | Disposition: A | Discharge status: Home / Self Care | Payer: BC Managed Care – PPO | Source: Ambulatory Visit | Attending: Internal Medicine | Admitting: Internal Medicine

## 2021-10-04 ENCOUNTER — Other Ambulatory Visit: Payer: Self-pay

## 2021-10-04 DIAGNOSIS — C50811 Malignant neoplasm of overlapping sites of right female breast: Secondary | ICD-10-CM | POA: Diagnosis not present

## 2021-10-04 DIAGNOSIS — D241 Benign neoplasm of right breast: Secondary | ICD-10-CM | POA: Diagnosis not present

## 2021-10-04 DIAGNOSIS — R928 Other abnormal and inconclusive findings on diagnostic imaging of breast: Secondary | ICD-10-CM

## 2021-10-04 DIAGNOSIS — N6310 Unspecified lump in the right breast, unspecified quadrant: Secondary | ICD-10-CM

## 2021-10-04 DIAGNOSIS — N6311 Unspecified lump in the right breast, upper outer quadrant: Secondary | ICD-10-CM | POA: Diagnosis not present

## 2021-10-04 DIAGNOSIS — N6011 Diffuse cystic mastopathy of right breast: Secondary | ICD-10-CM | POA: Diagnosis not present

## 2021-10-04 HISTORY — PX: BREAST BIOPSY: SHX20

## 2021-10-05 ENCOUNTER — Telehealth: Payer: BC Managed Care – PPO | Admitting: Physician Assistant

## 2021-10-05 DIAGNOSIS — J069 Acute upper respiratory infection, unspecified: Secondary | ICD-10-CM | POA: Diagnosis not present

## 2021-10-05 LAB — SURGICAL PATHOLOGY

## 2021-10-05 MED ORDER — BENZONATATE 100 MG PO CAPS
100.0000 mg | ORAL_CAPSULE | Freq: Three times a day (TID) | ORAL | 0 refills | Status: DC | PRN
Start: 2021-10-05 — End: 2022-04-12

## 2021-10-05 MED ORDER — LIDOCAINE VISCOUS HCL 2 % MT SOLN: OROMUCOSAL | 0 refills | Status: DC

## 2021-10-05 MED ORDER — PSEUDOEPH-BROMPHEN-DM 30-2-10 MG/5ML PO SYRP
5.0000 mL | ORAL_SOLUTION | Freq: Four times a day (QID) | ORAL | 0 refills | Status: DC | PRN
Start: 2021-10-05 — End: 2022-04-12

## 2021-10-05 MED ORDER — FLUTICASONE PROPIONATE 50 MCG/ACT NA SUSP: 2.0000 | Freq: Every day | NASAL | 0 refills | Status: DC

## 2021-10-05 NOTE — Progress Notes (Signed)
Virtual Visit Consent   Jessica Robles, you are scheduled for a virtual visit with a [facility] provider today.     Just as with appointments in the office, your consent must be obtained to participate.  Your consent will be active for this visit and any virtual visit you may have with one of our providers in the next 365 days.     If you have a MyChart account, a copy of this consent can be sent to you electronically.  All virtual visits are billed to your insurance company just like a traditional visit in the office.    As this is a virtual visit, video technology does not allow for your provider to perform a traditional examination.  This may limit your provider's ability to fully assess your condition.  If your provider identifies any concerns that need to be evaluated in person or the need to arrange testing (such as labs, EKG, etc.), we will make arrangements to do so.     Although advances in technology are sophisticated, we cannot ensure that it will always work on either your end or our end.  If the connection with a video visit is poor, the visit may have to be switched to a telephone visit.  With either a video or telephone visit, we are not always able to ensure that we have a secure connection.     I need to obtain your verbal consent now.   Are you willing to proceed with your visit today?    Jessica Robles has provided verbal consent on 10/05/2021 for a virtual visit (video or telephone).   Mar Daring, PA-C   Date: 10/05/2021 11:52 AM   Virtual Visit via Video Note   I, Mar Daring, connected with  Jessica Robles  ([id number], 1971/03/16) on 10/05/21 at 11:45 AM EDT by a video-enabled telemedicine application and verified that I am speaking with the correct person using two identifiers.  Location: Patient: Virtual Visit Location Patient: Home Provider: Virtual Visit Location Provider: Home Office   I discussed the limitations of evaluation and management by  telemedicine and the availability of in person appointments. The patient expressed understanding and agreed to proceed.    History of Present Illness: Jessica Robles is a 50 y.o. who identifies as a female who was assigned female at birth, and is being seen today for sore throat.  HPI: Sore Throat  This is a new problem. The current episode started yesterday. There has been no fever. Associated symptoms include congestion, coughing and a hoarse voice. Pertinent negatives include no drooling, ear discharge, ear pain or shortness of breath. She has had no exposure to strep or mono. Exposure to: croop; all grandchildren. Treatments tried: advil cold and sinus. The treatment provided no relief.     Problems:  Patient Active Problem List   Diagnosis Date Noted   Cervical radiculopathy 03/07/2021   Right elbow pain 03/07/2021    Allergies: No Known Allergies Medications:  Current Outpatient Medications:    benzonatate (TESSALON) 100 MG capsule, Take 1 capsule (100 mg total) by mouth 3 (three) times daily as needed., Disp: 30 capsule, Rfl: 0   brompheniramine-pseudoephedrine-DM 30-2-10 MG/5ML syrup, Take 5 mLs by mouth 4 (four) times daily as needed., Disp: 120 mL, Rfl: 0   fluticasone (FLONASE) 50 MCG/ACT nasal spray, Place 2 sprays into both nostrils daily., Disp: 16 g, Rfl: 0   lidocaine (XYLOCAINE) 2 % solution, 86m swish and gargle for 15-20  seconds then swallow every 4 hours as needed for throat pain, Disp: 100 mL, Rfl: 0   cyclobenzaprine (FLEXERIL) 10 MG tablet, Take 1 tablet (10 mg total) by mouth 3 (three) times daily as needed for muscle spasms., Disp: 90 tablet, Rfl: 0   meloxicam (MOBIC) 15 MG tablet, TAKE 1 TABLET(15 MG) BY MOUTH DAILY, Disp: 90 tablet, Rfl: 0   Na Sulfate-K Sulfate-Mg Sulf (SUPREP BOWEL PREP KIT) 17.5-3.13-1.6 GM/177ML SOLN, Take 1 kit by mouth as directed., Disp: 354 mL, Rfl: 0  Observations/Objective: Patient is well-developed, well-nourished in no acute distress.   Resting comfortably at home.  Head is normocephalic, atraumatic.  No labored breathing.  Speech is clear and coherent with logical content.  Patient is alert and oriented at baseline.    Assessment and Plan: 1. Viral URI with cough - brompheniramine-pseudoephedrine-DM 30-2-10 MG/5ML syrup; Take 5 mLs by mouth 4 (four) times daily as needed.  Dispense: 120 mL; Refill: 0 - benzonatate (TESSALON) 100 MG capsule; Take 1 capsule (100 mg total) by mouth 3 (three) times daily as needed.  Dispense: 30 capsule; Refill: 0 - fluticasone (FLONASE) 50 MCG/ACT nasal spray; Place 2 sprays into both nostrils daily.  Dispense: 16 g; Refill: 0 - lidocaine (XYLOCAINE) 2 % solution; 58m swish and gargle for 15-20 seconds then swallow every 4 hours as needed for throat pain  Dispense: 100 mL; Refill: 0  - Recommend covid testing - Has had positive croup exposures (grandchildren) - Will treat with Bromfed DM and tessalon for cough - Viscous lidocaine for sore throat - Flonase for nasal congestion - Push fluids - Rest as needed - Seek in person evaluation if symptoms continue or worsen  Follow Up Instructions: I discussed the assessment and treatment plan with the patient. The patient was provided an opportunity to ask questions and all were answered. The patient agreed with the plan and demonstrated an understanding of the instructions.  A copy of instructions were sent to the patient via MyChart unless otherwise noted below.    The patient was advised to call back or seek an in-person evaluation if the symptoms worsen or if the condition fails to improve as anticipated.  Time:  I spent 10 minutes with the patient via telehealth technology discussing the above problems/concerns.    JMar Daring PA-C

## 2021-10-05 NOTE — Patient Instructions (Signed)
Jessica Robles, thank you for joining Jessica Daring, PA-C for today's virtual visit.  While this provider is not your primary care provider (PCP), if your PCP is located in our provider database this encounter information will be shared with them immediately following your visit.  Consent: (Patient) Jessica Robles provided verbal consent for this virtual visit at the beginning of the encounter.  Current Medications:  Current Outpatient Medications:    benzonatate (TESSALON) 100 MG capsule, Take 1 capsule (100 mg total) by mouth 3 (three) times daily as needed., Disp: 30 capsule, Rfl: 0   brompheniramine-pseudoephedrine-DM 30-2-10 MG/5ML syrup, Take 5 mLs by mouth 4 (four) times daily as needed., Disp: 120 mL, Rfl: 0   fluticasone (FLONASE) 50 MCG/ACT nasal spray, Place 2 sprays into both nostrils daily., Disp: 16 g, Rfl: 0   lidocaine (XYLOCAINE) 2 % solution, 21m swish and gargle for 15-20 seconds then swallow every 4 hours as needed for throat pain, Disp: 100 mL, Rfl: 0   cyclobenzaprine (FLEXERIL) 10 MG tablet, Take 1 tablet (10 mg total) by mouth 3 (three) times daily as needed for muscle spasms., Disp: 90 tablet, Rfl: 0   meloxicam (MOBIC) 15 MG tablet, TAKE 1 TABLET(15 MG) BY MOUTH DAILY, Disp: 90 tablet, Rfl: 0   Na Sulfate-K Sulfate-Mg Sulf (SUPREP BOWEL PREP KIT) 17.5-3.13-1.6 GM/177ML SOLN, Take 1 kit by mouth as directed., Disp: 354 mL, Rfl: 0   Medications ordered in this encounter:  Meds ordered this encounter  Medications   brompheniramine-pseudoephedrine-DM 30-2-10 MG/5ML syrup    Sig: Take 5 mLs by mouth 4 (four) times daily as needed.    Dispense:  120 mL    Refill:  0    Order Specific Question:   Supervising Provider    Answer:   MILLER, BRIAN [[id number]]   benzonatate (TESSALON) 100 MG capsule    Sig: Take 1 capsule (100 mg total) by mouth 3 (three) times daily as needed.    Dispense:  30 capsule    Refill:  0    Order Specific Question:   Supervising Provider     Answer:   MILLER, BRIAN [[id number]]   fluticasone (FLONASE) 50 MCG/ACT nasal spray    Sig: Place 2 sprays into both nostrils daily.    Dispense:  16 g    Refill:  0    Order Specific Question:   Supervising Provider    Answer:   MILLER, BRIAN [[id number]]   lidocaine (XYLOCAINE) 2 % solution    Sig: 577mswish and gargle for 15-20 seconds then swallow every 4 hours as needed for throat pain    Dispense:  100 mL    Refill:  0    Order Specific Question:   Supervising Provider    Answer:   MINoemi [id number]]     *If you need refills on other medications prior to your next appointment, please contact your pharmacy*  Follow-Up: Call back or seek an in-person evaluation if the symptoms worsen or if the condition fails to improve as anticipated.  Other Instructions Viral Respiratory Infection A viral respiratory infection is an illness that affects parts of the body that are used for breathing. These include the lungs, nose, and throat. It is caused by a germ called a virus. Some examples of this kind of infection are: A cold. The flu (influenza). A respiratory syncytial virus (RSV) infection. What are the causes? This condition is caused by a virus. It spreads from person to person. You can  get the virus if: You breathe in droplets from someone who is sick. You come in contact with people who are sick. You touch mucus or other fluid from a person who is sick. What are the signs or symptoms? Symptoms of this condition include: A stuffy or runny nose. A sore throat. A cough. Shortness of breath. Trouble breathing. Yellow or green fluid in the nose. Other symptoms may include: A fever. Sweating or chills. Tiredness (fatigue). Achy muscles. A headache. How is this treated? This condition may be treated with: Medicines that treat viruses. Medicines that make it easy to breathe. Medicines that are sprayed into the nose. Acetaminophen or NSAIDs, such as ibuprofen, to treat fever. Follow  these instructions at home: Managing pain and congestion Take over-the-counter and prescription medicines only as told by your doctor. If you have a sore throat, gargle with salt water. Do this 3-4 times a day or as needed. To make salt water, dissolve -1 tsp (3-6 g) of salt in 1 cup (237 mL) of warm water. Make sure that all the salt dissolves. Use nose drops made from salt water. This helps with stuffiness (congestion). It also helps soften the skin around your nose. Take 2 tsp (10 mL) of honey at bedtime to lessen coughing at night. Do not give honey to children who are younger than 48 year old. Drink enough fluid to keep your pee (urine) pale yellow. General instructions  Rest as much as possible. Do not drink alcohol. Do not smoke or use any products that contain nicotine or tobacco. If you need help quitting, ask your doctor. Keep all follow-up visits. How is this prevented?   Get a flu shot every year. Ask your doctor when you should get your flu shot. Do not let other people get your germs. If you are sick: Wash your hands with soap and water often. Wash your hands after you cough or sneeze. Wash hands for at least 20 seconds. If you cannot use soap and water, use hand sanitizer. Cover your mouth when you cough. Cover your nose and mouth when you sneeze. Do not share cups or eating utensils. Clean commonly used objects often. Clean commonly touched surfaces. Stay home from work or school. Avoid contact with people who are sick during cold and flu season. This is in fall and winter. Get help if: Your symptoms last for 10 days or longer. Your symptoms get worse over time. You have very bad pain in your face or forehead. Parts of your jaw or neck get very swollen. You have shortness of breath. Get help right away if: You feel pain or pressure in your chest. You have trouble breathing. You faint or feel like you will faint. You keep vomiting and it gets worse. You feel  confused. These symptoms may be an emergency. Get help right away. Call your local emergency services (911 in the [place] Do not wait to see if the symptoms will go away. Do not drive yourself to the hospital. Summary A viral respiratory infection is an illness that affects parts of the body that are used for breathing. Examples of this illness include a cold, the flu, and a respiratory syncytial virus (RSV) infection. The infection can cause a runny nose, cough, sore throat, and fever. Follow what your doctor tells you about taking medicines, drinking lots of fluid, washing your hands, resting at home, and avoiding people who are sick. This information is not intended to replace advice given to you by your health  care provider. Make sure you discuss any questions you have with your health care provider. Document Revised: 03/08/2021 Document Reviewed: 03/08/2021 Elsevier Patient Education  2022 [employer].    If you have been instructed to have an in-person evaluation today at a local Urgent Care facility, please use the link below. It will take you to a list of all of our available [facility], including address, phone number and hours of operation. Please do not delay care.  [facility]  If you or a family member do not have a primary care provider, use the link below to schedule a visit and establish care. When you choose a [facility] primary care physician or advanced practice provider, you gain a long-term partner in health. Find a Primary Care Provider  Learn more about 's in-office and virtual care options: [facility]

## 2022-02-27 DIAGNOSIS — J019 Acute sinusitis, unspecified: Secondary | ICD-10-CM | POA: Diagnosis not present

## 2022-03-21 DIAGNOSIS — M4722 Other spondylosis with radiculopathy, cervical region: Secondary | ICD-10-CM | POA: Diagnosis not present

## 2022-03-21 DIAGNOSIS — M4692 Unspecified inflammatory spondylopathy, cervical region: Secondary | ICD-10-CM | POA: Diagnosis not present

## 2022-03-21 DIAGNOSIS — M25552 Pain in left hip: Secondary | ICD-10-CM | POA: Diagnosis not present

## 2022-04-12 ENCOUNTER — Other Ambulatory Visit: Payer: Self-pay | Admitting: Internal Medicine

## 2022-04-12 DIAGNOSIS — M5412 Radiculopathy, cervical region: Secondary | ICD-10-CM

## 2022-04-12 MED ORDER — MELOXICAM 15 MG PO TABS: 15.0000 mg | ORAL_TABLET | Freq: Every day | ORAL | 0 refills | Status: DC

## 2022-04-12 MED ORDER — CYCLOBENZAPRINE HCL 10 MG PO TABS: 10.0000 mg | ORAL_TABLET | Freq: Three times a day (TID) | ORAL | 0 refills | Status: DC | PRN

## 2022-08-05 ENCOUNTER — Encounter: Payer: Self-pay | Admitting: Internal Medicine

## 2022-08-05 DIAGNOSIS — R928 Other abnormal and inconclusive findings on diagnostic imaging of breast: Secondary | ICD-10-CM | POA: Insufficient documentation

## 2022-08-06 ENCOUNTER — Ambulatory Visit (INDEPENDENT_AMBULATORY_CARE_PROVIDER_SITE_OTHER): Payer: BC Managed Care – PPO | Admitting: Internal Medicine

## 2022-08-06 ENCOUNTER — Encounter: Payer: Self-pay | Admitting: Internal Medicine

## 2022-08-06 VITALS — BP 122/70 | HR 83 | Ht 66.0 in | Wt 219.4 lb

## 2022-08-06 DIAGNOSIS — E785 Hyperlipidemia, unspecified: Secondary | ICD-10-CM | POA: Diagnosis not present

## 2022-08-06 DIAGNOSIS — M2550 Pain in unspecified joint: Secondary | ICD-10-CM

## 2022-08-06 DIAGNOSIS — R928 Other abnormal and inconclusive findings on diagnostic imaging of breast: Secondary | ICD-10-CM

## 2022-08-06 DIAGNOSIS — Z1159 Encounter for screening for other viral diseases: Secondary | ICD-10-CM

## 2022-08-06 DIAGNOSIS — Z131 Encounter for screening for diabetes mellitus: Secondary | ICD-10-CM | POA: Diagnosis not present

## 2022-08-06 DIAGNOSIS — Z0000 Encounter for general adult medical examination without abnormal findings: Secondary | ICD-10-CM

## 2022-08-06 NOTE — Progress Notes (Signed)
Date:  08/06/2022   Name:  Jessica Robles   DOB:  [birth year]   MRN:  [id number]   Chief Complaint: Annual Exam Jessica Robles is a 51 y.o. female who presents today for her Complete Annual Exam. She feels well. She reports exercising. She reports she is sleeping poorly. Breast complaints - right breast still tender from having clip placed last year.  Mammogram: 09/2021 abnormal right - bx fibroadenoma rec Dx Mammo and [place] in 6 mo. DEXA: none Pap smear: 07/2021 neg/neg Colonoscopy: 08/2021 repeat 3 yrs  Health Maintenance Due  Topic Date Due   Pneumococcal Vaccine 34-3 Years old (1 - PCV) Never done   HIV Screening  Never done   Hepatitis C Screening  Never done   TETANUS/TDAP  Never done   Zoster Vaccines- Shingrix (1 of 2) Never done   INFLUENZA VACCINE  07/16/2022     There is no immunization history on file for this patient.  Hip Pain  There was no injury mechanism. The pain is present in the left hip. The pain is moderate. The pain has been Fluctuating since onset. Pertinent negatives include no numbness or tingling. The symptoms are aggravated by palpation and weight bearing. She has tried NSAIDs for the symptoms. The treatment provided no relief.  Ankle Pain  There was no injury mechanism. The pain is present in the left ankle. The quality of the pain is described as aching. The pain is moderate. Pertinent negatives include no numbness or tingling. She has tried NSAIDs for the symptoms. The treatment provided mild relief.    Lab Results  Component Value Date   NA 143 07/18/2021   K 5.0 07/18/2021   CO2 22 07/18/2021   GLUCOSE 114 (H) 07/18/2021   BUN 24 07/18/2021   CREATININE 0.73 07/18/2021   CALCIUM 9.8 07/18/2021   EGFR 100 07/18/2021   Lab Results  Component Value Date   CHOL 194 07/18/2021   HDL 50 07/18/2021   LDLCALC 129 (H) 07/18/2021   TRIG 84 07/18/2021   CHOLHDL 3.9 07/18/2021   Lab Results  Component Value Date   TSH 1.710 07/18/2021   No  results found for: "HGBA1C" Lab Results  Component Value Date   WBC 6.8 07/18/2021   HGB 15.5 07/18/2021   HCT 46.0 07/18/2021   MCV 91 07/18/2021   PLT 303 07/18/2021   Lab Results  Component Value Date   ALT 16 07/18/2021   AST 19 07/18/2021   ALKPHOS 75 07/18/2021   BILITOT 0.3 07/18/2021   No results found for: "25OHVITD2", "25OHVITD3", "VD25OH"   Review of Systems  Constitutional:  Negative for chills, fatigue and fever.  HENT:  Negative for congestion, hearing loss, tinnitus, trouble swallowing and voice change.   Eyes:  Negative for visual disturbance.  Respiratory:  Negative for cough, chest tightness, shortness of breath and wheezing.   Cardiovascular:  Negative for chest pain, palpitations and leg swelling.  Gastrointestinal:  Negative for abdominal pain, constipation, diarrhea and vomiting.  Endocrine: Negative for polydipsia and polyuria.  Genitourinary:  Negative for dysuria, frequency, genital sores, vaginal bleeding and vaginal discharge.  Musculoskeletal:  Positive for arthralgias (left hip) and back pain. Negative for gait problem and joint swelling.  Skin:  Negative for color change and rash.  Neurological:  Negative for dizziness, tingling, tremors, light-headedness, numbness and headaches.  Hematological:  Negative for adenopathy. Does not bruise/bleed easily.  Psychiatric/Behavioral:  Negative for dysphoric mood and sleep disturbance. The patient is  not nervous/anxious.     Patient Active Problem List   Diagnosis Date Noted   Abnormal mammogram of right breast 08/05/2022   Cervical radiculopathy 03/07/2021   Right elbow pain 03/07/2021    No Known Allergies  Past Surgical History:  Procedure Laterality Date   BREAST BIOPSY Right 10/04/2021   u/s bx, 9:00 "heart" clip-path pending   COLONOSCOPY N/A 08/17/2021   Procedure: COLONOSCOPY;  Surgeon: Jonathon Bellows, MD;  Location: [facility] ENDOSCOPY;  Service: Gastroenterology;  Laterality: N/A;    Social  History   Tobacco Use   Smoking status: Every Day    Packs/day: 0.50    Years: 36.00    Total pack years: 18.00    Types: Cigarettes   Smokeless tobacco: Never  Vaping Use   Vaping Use: Never used  Substance Use Topics   Alcohol use: Yes    Comment: occasional   Drug use: Never     Medication list has been reviewed and updated.  Current Meds  Medication Sig   [DISCONTINUED] Azelastine-Fluticasone 137-50 MCG/ACT SUSP Place 1 spray into both nostrils 2 (two) times daily.   [DISCONTINUED] cyclobenzaprine (FLEXERIL) 10 MG tablet Take 1 tablet (10 mg total) by mouth 3 (three) times daily as needed for muscle spasms.   [DISCONTINUED] fluticasone (FLONASE) 50 MCG/ACT nasal spray Place 2 sprays into both nostrils daily.   [DISCONTINUED] meloxicam (MOBIC) 15 MG tablet Take 1 tablet (15 mg total) by mouth daily.       08/06/2022   10:41 AM 07/18/2021   10:53 AM 03/07/2021    2:48 PM  GAD 7 : Generalized Anxiety Score  Nervous, Anxious, on Edge 0 0 0  Control/stop worrying 0 0 0  Worry too much - different things 0 0 0  Trouble relaxing 0 0 0  Restless 0 0 0  Easily annoyed or irritable 0 1 1  Afraid - awful might happen 0 0 0  Total GAD 7 Score 0 1 1  Anxiety Difficulty Not difficult at all Not difficult at all        08/06/2022   10:40 AM 07/18/2021   10:52 AM 03/07/2021    2:48 PM  Depression screen PHQ 2/9  Decreased Interest 0 0 0  Down, Depressed, Hopeless 0 0 0  PHQ - 2 Score 0 0 0  Altered sleeping _0 Tired, decreased energy 3 0 1  Change in appetite 0 0 0  Feeling bad or failure about yourself  0 0 0  Trouble concentrating 0 0 0  Moving slowly or fidgety/restless 0 0 0  Suicidal thoughts 0 0 0  PHQ-9 Score _1 Difficult doing work/chores Not difficult at all Not difficult at all     BP Readings from Last 3 Encounters:  08/06/22 122/70  08/17/21 128/62  07/18/21 112/78    Physical Exam Vitals and nursing note reviewed.  Constitutional:       General: She is not in acute distress.    Appearance: She is well-developed.  HENT:     Head: Normocephalic and atraumatic.     Right Ear: Tympanic membrane and ear canal normal.     Left Ear: Tympanic membrane and ear canal normal.     Nose:     Right Sinus: No maxillary sinus tenderness.     Left Sinus: No maxillary sinus tenderness.  Eyes:     General: No scleral icterus.       Right eye: No discharge.  Left eye: No discharge.     Conjunctiva/sclera: Conjunctivae normal.  Neck:     Thyroid: No thyromegaly.     Vascular: No carotid bruit.  Cardiovascular:     Rate and Rhythm: Normal rate and regular rhythm.     Pulses: Normal pulses.     Heart sounds: Normal heart sounds.  Pulmonary:     Effort: Pulmonary effort is normal. No respiratory distress.     Breath sounds: No wheezing.  Chest:  Breasts:    Right: No mass, nipple discharge, skin change or tenderness.     Left: No mass, nipple discharge, skin change or tenderness.  Abdominal:     General: Bowel sounds are normal.     Palpations: Abdomen is soft.     Tenderness: There is no abdominal tenderness.  Musculoskeletal:     Right wrist: No swelling or deformity.     Left wrist: No swelling or deformity.     Right hand: Bony tenderness present.     Left hand: Bony tenderness present.     Cervical back: Normal range of motion. No erythema.     Lumbar back: Negative right straight leg raise test and negative left straight leg raise test.     Right hip: Normal.     Left hip: Tenderness present. Decreased range of motion.     Right lower leg: No edema.     Left lower leg: No edema.     Left ankle: Swelling present. Tenderness present over the medial malleolus. Decreased range of motion.     Comments: Esp MCP joints  Lymphadenopathy:     Cervical: No cervical adenopathy.  Skin:    General: Skin is warm and dry.     Findings: No rash.  Neurological:     Mental Status: She is alert and oriented to person, place, and  time.     Cranial Nerves: No cranial nerve deficit.     Sensory: No sensory deficit.     Deep Tendon Reflexes: Reflexes are normal and symmetric.  Psychiatric:        Attention and Perception: Attention normal.        Mood and Affect: Mood normal.     Wt Readings from Last 3 Encounters:  08/06/22 219 lb 6.4 oz (99.5 kg)  08/17/21 196 lb (88.9 kg)  07/18/21 199 lb (90.3 kg)    BP 122/70   Pulse 83   Ht _0  (1.676 m)   Wt 219 lb 6.4 oz (99.5 kg)   SpO2 95%   BMI 35.41 kg/m   Assessment and Plan: 1. Annual physical exam Exam is normal except for weight. Encourage regular exercise and appropriate dietary changes. Pap and CRC screenings up to date. - CBC with Differential/Platelet - Comprehensive metabolic panel  2. Abnormal mammogram of right breast Did not return for 6 mo follow up so will order bilateral DX with [place] - MM DIAG BREAST TOMO BILATERAL - US BREAST LTD UNI RIGHT INC AXILLA - US BREAST LTD UNI LEFT INC AXILLA  3. Screening for diabetes mellitus - Hemoglobin A1c  4. Mild hyperlipidemia - Lipid panel  5. Need for hepatitis C screening test - Hepatitis C antibody  6. Arthralgia of multiple joints Multiple joint complaints - hands, ankles, back with family hx RA If labs negative would refer to Dr. Zigmund Daniel for help with hip and ankle pain. She has seen Ortho -they wanted MRI but not approved by insurance Tried Mobic and other NSAIDS without benefit. -  Rheumatoid factor - Sedimentation rate   Partially dictated using Editor, commissioning. Any errors are unintentional.  Halina Maidens, MD [facility]  08/06/2022

## 2022-08-07 LAB — COMPREHENSIVE METABOLIC PANEL
ALT: 47 IU/L — ABNORMAL HIGH (ref 0–32)
AST: 35 IU/L (ref 0–40)
Albumin/Globulin Ratio: 1.6 (ref 1.2–2.2)
Albumin: 4.4 g/dL (ref 3.8–4.9)
Alkaline Phosphatase: 96 IU/L (ref 44–121)
BUN/Creatinine Ratio: 41 — ABNORMAL HIGH (ref 9–23)
BUN: 28 mg/dL — ABNORMAL HIGH (ref 6–24)
Bilirubin Total: 0.2 mg/dL (ref 0.0–1.2)
CO2: 22 mmol/L (ref 20–29)
Calcium: 9.6 mg/dL (ref 8.7–10.2)
Chloride: 103 mmol/L (ref 96–106)
Creatinine, Ser: 0.68 mg/dL (ref 0.57–1.00)
Globulin, Total: 2.8 g/dL (ref 1.5–4.5)
Glucose: 113 mg/dL — ABNORMAL HIGH (ref 70–99)
Potassium: 5 mmol/L (ref 3.5–5.2)
Sodium: 143 mmol/L (ref 134–144)
Total Protein: 7.2 g/dL (ref 6.0–8.5)
eGFR: 105 mL/min/{1.73_m2} (ref >59)

## 2022-08-07 LAB — CBC WITH DIFFERENTIAL/PLATELET
Basophils Absolute: 0 10*3/uL (ref 0.0–0.2)
Basos: 1 %
EOS (ABSOLUTE): 0.1 10*3/uL (ref 0.0–0.4)
Eos: 2 %
Hematocrit: 42.3 % (ref 34.0–46.6)
Hemoglobin: 14.2 g/dL (ref 11.1–15.9)
Immature Grans (Abs): 0 10*3/uL (ref 0.0–0.1)
Immature Granulocytes: 0 %
Lymphocytes Absolute: 1.9 10*3/uL (ref 0.7–3.1)
Lymphs: 30 %
MCH: 29.7 pg (ref 26.6–33.0)
MCHC: 33.6 g/dL (ref 31.5–35.7)
MCV: 89 fL (ref 79–97)
Monocytes Absolute: 0.5 10*3/uL (ref 0.1–0.9)
Monocytes: 8 %
Neutrophils Absolute: 3.9 10*3/uL (ref 1.4–7.0)
Neutrophils: 59 %
Platelets: 301 10*3/uL (ref 150–450)
RBC: 4.78 x10E6/uL (ref 3.77–5.28)
RDW: 12.8 % (ref 11.7–15.4)
WBC: 6.5 10*3/uL (ref 3.4–10.8)

## 2022-08-07 LAB — LIPID PANEL
Chol/HDL Ratio: 4.3 ratio (ref 0.0–4.4)
Cholesterol, Total: 236 mg/dL — ABNORMAL HIGH (ref 100–199)
HDL: 55 mg/dL (ref >39)
LDL Chol Calc (NIH): 150 mg/dL — ABNORMAL HIGH (ref 0–99)
Triglycerides: 172 mg/dL — ABNORMAL HIGH (ref 0–149)
VLDL Cholesterol Cal: 31 mg/dL (ref 5–40)

## 2022-08-07 LAB — RHEUMATOID FACTOR: Rheumatoid fact SerPl-aCnc: 13.9 IU/mL (ref <14.0)

## 2022-08-07 LAB — HEPATITIS C ANTIBODY: Hep C Virus Ab: NONREACTIVE

## 2022-08-07 LAB — HEMOGLOBIN A1C
Est. average glucose Bld gHb Est-mCnc: 126 mg/dL
Hgb A1c MFr Bld: 6 % — ABNORMAL HIGH (ref 4.8–5.6)

## 2022-08-07 LAB — SEDIMENTATION RATE: Sed Rate: 3 mm/hr (ref 0–40)

## 2022-08-14 ENCOUNTER — Encounter: Payer: Self-pay | Admitting: Internal Medicine

## 2022-08-22 ENCOUNTER — Ambulatory Visit: Payer: BC Managed Care – PPO | Admitting: Family Medicine

## 2022-08-22 ENCOUNTER — Encounter: Payer: Self-pay | Admitting: Family Medicine

## 2022-08-22 ENCOUNTER — Ambulatory Visit
Admission: RE | Admit: 2022-08-22 | Discharge: 2022-08-22 | Disposition: A | Discharge status: Home / Self Care | Payer: BC Managed Care – PPO | Source: Ambulatory Visit | Attending: Family Medicine | Admitting: Family Medicine

## 2022-08-22 ENCOUNTER — Ambulatory Visit
Admission: RE | Admit: 2022-08-22 | Discharge: 2022-08-22 | Disposition: A | Discharge status: Home / Self Care | Payer: BC Managed Care – PPO | Attending: Family Medicine | Admitting: Family Medicine

## 2022-08-22 VITALS — BP 128/88 | HR 78 | Ht 66.0 in | Wt 219.0 lb

## 2022-08-22 DIAGNOSIS — M25552 Pain in left hip: Secondary | ICD-10-CM | POA: Insufficient documentation

## 2022-08-22 DIAGNOSIS — M545 Low back pain, unspecified: Secondary | ICD-10-CM | POA: Diagnosis not present

## 2022-08-22 DIAGNOSIS — M25562 Pain in left knee: Secondary | ICD-10-CM | POA: Diagnosis not present

## 2022-08-22 DIAGNOSIS — M5442 Lumbago with sciatica, left side: Secondary | ICD-10-CM | POA: Diagnosis not present

## 2022-08-22 DIAGNOSIS — G8929 Other chronic pain: Secondary | ICD-10-CM

## 2022-08-22 DIAGNOSIS — M7672 Peroneal tendinitis, left leg: Secondary | ICD-10-CM | POA: Diagnosis not present

## 2022-08-22 MED ORDER — METHOCARBAMOL 750 MG PO TABS: 750.0000 mg | ORAL_TABLET | Freq: Every evening | ORAL | 0 refills | Status: DC | PRN

## 2022-08-22 MED ORDER — GABAPENTIN 300 MG PO CAPS: 300.0000 mg | ORAL_CAPSULE | Freq: Every day | ORAL | 0 refills | Status: DC

## 2022-08-22 MED ORDER — DICLOFENAC SODIUM 75 MG PO TBEC: 75.0000 mg | DELAYED_RELEASE_TABLET | Freq: Two times a day (BID) | ORAL | 0 refills | Status: DC

## 2022-08-22 NOTE — Patient Instructions (Signed)
-   Dose diclofenac twice daily (take with food) - Dose nightly gabapentin, side effect can be drowsiness - Can dose methocarbamol (muscle relaxer) nightly on as-needed basis - Start physical therapy, referral coordinator will contact you for scheduling - Return for follow-up in 6 weeks, contact for questions/concerns between now and then

## 2022-08-22 NOTE — Progress Notes (Signed)
Primary Care / Sports Medicine Office Visit  Patient Information:  Patient ID: Jessica Robles, female DOB: 07-23-1971 Age: 51 y.o. MRN: [id number]   Jessica Robles is a pleasant 51 y.o. 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    Knee Pain    Left knee hurts while standing for hours, for 1 month    Vitals:   08/22/22 1350  BP: 128/88  Pulse: 78  SpO2: 98%   Vitals:   08/22/22 1350  Weight: 219 lb (99.3 kg)  Height: '5\' 6"'$  (1.676 m)   Body mass index is 35.35 kg/m.  No results found.   Independent interpretation of notes and tests performed by another provider:   None  Procedures performed:   None  Pertinent History, Exam, Impression, and Recommendations:   Problem List Items Addressed This Visit       Nervous and Auditory   Chronic bilateral low back pain with left-sided sciatica    Chronic issue with ongoing symptomatology, notes bilateral paresthesias with prolonged standing, works as a Theme park manager, left symptoms are more noticeable and frequent than the contralateral side.  Examination reveals positive straight leg raise on the left, pain with positive Kemps test, tenderness throughout the sacroiliac regions bilaterally and gluteus medius/minimus on the left.  Plan for dedicated x-rays, scheduled diclofenac and gabapentin, as needed methocarbamol, and formal PT.  For recalcitrant symptoms at return in 6 weeks, pending localization of pain, can consider ultrasound-guided local injections versus advanced imaging and referral to pain and spine group.      Relevant Medications   diclofenac (VOLTAREN) 75 MG EC tablet   gabapentin (NEURONTIN) 300 MG capsule   methocarbamol (ROBAXIN) 750 MG tablet   Other Relevant  Orders   DG Lumbar Spine Complete   Ambulatory referral to Physical Therapy     Musculoskeletal and Integument   Greater trochanteric pain syndrome of left lower extremity - Primary    Patient with chronic, roughly 2-year history of left lateral hip pain and groin pain without significant radiation, aggravated with prolonged weightbearing, prolonged sitting, pain with trying to lay on the left side, and walking.  She does have positive straight leg raise, focal tenderness that recreates her symptoms at the greater trochanteric region, additionally positive FADIR.  Plan for scheduled diclofenac, gabapentin for comorbid radicular features, and as needed methocarbamol.  She will start formal physical therapy and maintain close follow-up in 6 weeks for reevaluation.  Suboptimal progress can be addressed with intra-articular and/or greater trochanteric corticosteroid injections under ultrasound guidance.      Relevant Orders   DG Hip Unilat W OR W/O Pelvis 2-3 Views Left   Ambulatory referral to Physical Therapy   Peroneal tendinitis of left lower leg    Left ankle lateral pain and swelling with prolonged weightbearing in the setting of ipsilateral knee and hip symptoms.  Her findings today show focality of tenderness along the peroneal tendon posterior to the lateral malleolus, secondarily to the base of the fifth metatarsal, aggravated by resisted plantarflexion and eversion.  She will be placed on medications as well as PT, recalcitrant symptoms can be addressed with immobilization, further medication management.  Relevant Orders   Ambulatory referral to Physical Therapy     Other   Arthralgia of left knee    Left knee lateral pain ongoing for several weeks in the setting of longstanding ipsilateral left hip and low back pain.  Examination reveals full painless range of motion, tenderness at the lateral joint line, otherwise provocative testing is benign without ligamentous laxity  appreciated.  Findings are most consistent with secondary/compensatory left knee lateral tibiofemoral compartment arthralgia.  Anticipate that this should improve as low back and hip symptoms improve, she will be placed on medication regimen as well as formal PT. I will obtain dedicated x-rays to rule out osteoarthritis component.  We will have the patient return in 6 weeks for reevaluation, suboptimal progress can be addressed with corticosteroid injection.      Relevant Orders   DG Knee Complete 4 Views Left   Ambulatory referral to Physical Therapy     Orders & Medications Meds ordered this encounter  Medications   diclofenac (VOLTAREN) 75 MG EC tablet    Sig: Take 1 tablet (75 mg total) by mouth 2 (two) times daily.    Dispense:  90 tablet    Refill:  0   gabapentin (NEURONTIN) 300 MG capsule    Sig: Take 1 capsule (300 mg total) by mouth at bedtime.    Dispense:  45 capsule    Refill:  0   methocarbamol (ROBAXIN) 750 MG tablet    Sig: Take 1 tablet (750 mg total) by mouth at bedtime as needed for muscle spasms.    Dispense:  30 tablet    Refill:  0   Orders Placed This Encounter  Procedures   DG Lumbar Spine Complete   DG Hip Unilat W OR W/O Pelvis 2-3 Views Left   DG Knee Complete 4 Views Left   Ambulatory referral to Physical Therapy     Return in about 6 weeks (around 10/03/2022).     Montel Culver, MD   Primary Care Sports Medicine [place]

## 2022-08-22 NOTE — Assessment & Plan Note (Signed)
Patient with chronic, roughly 2-year history of left lateral hip pain and groin pain without significant radiation, aggravated with prolonged weightbearing, prolonged sitting, pain with trying to lay on the left side, and walking.  She does have positive straight leg raise, focal tenderness that recreates her symptoms at the greater trochanteric region, additionally positive FADIR.  Plan for scheduled diclofenac, gabapentin for comorbid radicular features, and as needed methocarbamol.  She will start formal physical therapy and maintain close follow-up in 6 weeks for reevaluation.  Suboptimal progress can be addressed with intra-articular and/or greater trochanteric corticosteroid injections under ultrasound guidance.

## 2022-08-22 NOTE — Assessment & Plan Note (Signed)
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  Examination reveals positive straight leg raise on the left, pain with positive Kemps test, tenderness throughout the sacroiliac regions bilaterally and gluteus medius/minimus on the left.  Plan for dedicated x-rays, scheduled diclofenac and gabapentin, as needed methocarbamol, and formal PT.  For recalcitrant symptoms at return in 6 weeks, pending localization of pain, can consider ultrasound-guided local injections versus advanced imaging and referral to pain and spine group.

## 2022-08-22 NOTE — Assessment & Plan Note (Addendum)
Left knee lateral pain ongoing for several weeks in the setting of longstanding ipsilateral left hip and low back pain.  Examination reveals full painless range of motion, tenderness at the lateral joint line, otherwise provocative testing is benign without ligamentous laxity appreciated.  Findings are most consistent with secondary/compensatory left knee lateral tibiofemoral compartment arthralgia.  Anticipate that this should improve as low back and hip symptoms improve, she will be placed on medication regimen as well as formal PT. I will obtain dedicated x-rays to rule out osteoarthritis component.  We will have the patient return in 6 weeks for reevaluation, suboptimal progress can be addressed with corticosteroid injection.

## 2022-08-22 NOTE — Assessment & Plan Note (Signed)
Left ankle lateral pain and swelling with prolonged weightbearing in the setting of ipsilateral knee and hip symptoms.  Her findings today show focality of tenderness along the peroneal tendon posterior to the lateral malleolus, secondarily to the base of the fifth metatarsal, aggravated by resisted plantarflexion and eversion.  She will be placed on medications as well as PT, recalcitrant symptoms can be addressed with immobilization, further medication management.

## 2022-08-29 ENCOUNTER — Ambulatory Visit (INDEPENDENT_AMBULATORY_CARE_PROVIDER_SITE_OTHER): Payer: BC Managed Care – PPO | Admitting: Family Medicine

## 2022-08-29 ENCOUNTER — Ambulatory Visit
Admission: RE | Admit: 2022-08-29 | Discharge: 2022-08-29 | Disposition: A | Discharge status: Home / Self Care | Payer: BC Managed Care – PPO | Attending: Family Medicine | Admitting: Family Medicine

## 2022-08-29 ENCOUNTER — Ambulatory Visit
Admission: RE | Admit: 2022-08-29 | Discharge: 2022-08-29 | Disposition: A | Discharge status: Home / Self Care | Payer: BC Managed Care – PPO | Source: Ambulatory Visit | Attending: Family Medicine | Admitting: Family Medicine

## 2022-08-29 ENCOUNTER — Encounter: Payer: Self-pay | Admitting: Family Medicine

## 2022-08-29 ENCOUNTER — Other Ambulatory Visit: Payer: Self-pay

## 2022-08-29 VITALS — BP 114/82 | HR 78 | Ht 66.0 in | Wt 220.0 lb

## 2022-08-29 DIAGNOSIS — M7672 Peroneal tendinitis, left leg: Secondary | ICD-10-CM

## 2022-08-29 DIAGNOSIS — M25572 Pain in left ankle and joints of left foot: Secondary | ICD-10-CM

## 2022-08-29 DIAGNOSIS — M7989 Other specified soft tissue disorders: Secondary | ICD-10-CM | POA: Diagnosis not present

## 2022-08-29 DIAGNOSIS — M25562 Pain in left knee: Secondary | ICD-10-CM

## 2022-08-29 DIAGNOSIS — M25579 Pain in unspecified ankle and joints of unspecified foot: Secondary | ICD-10-CM | POA: Diagnosis not present

## 2022-08-29 NOTE — Patient Instructions (Signed)
-   Dose diclofenac twice daily scheduled, take with food - Continue nightly gabapentin - Can use muscle relaxer (methocarbamol) on as-needed basis for additional symptom control - Use cam boot at all times while on your feet x1 week - After 1 week, continue cam boot while outside the house, while inside of the house can gradually wean from boot as symptoms allow - After 2 weeks, gradually wean and discontinue cam boot usage - Start home exercises this weekend and focus on slow and steady advance - Maintain follow-up as scheduled in 1 month, contact for questions between now and then

## 2022-08-29 NOTE — Telephone Encounter (Signed)
Please advise 

## 2022-08-29 NOTE — Assessment & Plan Note (Signed)
Chronic condition with worsening symptoms over the past several days, denies any change in activity or injury at onset of worsening.  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  Pain to the lateral lower leg and lateral ankle to midfoot, aggravated with weightbearing, no paresthesias.  Examination shows focality to the peroneal longus and brevis both at the origin and at the insertion, given her persistent symptomatology despite recent initiation of diclofenac, nature of her work, we discussed short-term immobilization in a short leg Cam boot, home-based rehab, and Rx management guidelines.  She will return in 1 month for reevaluation, suboptimal progress can be addressed with advanced imaging, formal PT, modification of medications.

## 2022-08-29 NOTE — Assessment & Plan Note (Signed)
Chronic, noted in the setting of concomitant ipsilateral left hip and left foot/ankle pain.  Fortunately recent x-rays are reassuring from intraoperative standpoint and her examination is otherwise nonfocal at the knee.  Her lateral knee pain most likely represents a sequela of IT band syndrome as well as peroneal tendinopathy proximally. See additional assessment(s) for plan details.

## 2022-08-29 NOTE — Progress Notes (Signed)
     Primary Care / Sports Medicine Office Visit  Patient Information:  Patient ID: Jessica Robles, female DOB: [birth year] Age: 51 y.o. MRN: [id number]   Jessica Robles is a pleasant 51 y.o. female presenting with the following:  Chief Complaint  Patient presents with   Ankle Pain    Left ankle swelling for 4 days, has been off and on for months, radiates up left leg.     Vitals:   08/29/22 1532  BP: 114/82  Pulse: 78  SpO2: 99%   Vitals:   08/29/22 1532  Weight: 220 lb (99.8 kg)  Height: '5\' 6"'$  (1.676 m)   Body mass index is 35.51 kg/m.     Independent interpretation of notes and tests performed by another provider:   Independent interpretation of left knee x-ray dated 08/24/2022 reveals maintained joint space throughout, no acute osseous abnormalities.  Independent interpretation of the left foot and ankle x-rays dated 08/29/2022 reveals mild/subtle degenerative changes at both the lateral and medial malleoli inferiorly, there is trace evidence of insertional Achilles enthesophyte and calcaneal spur, there is mild degenerative changes at the articulation between the base of the fifth metatarsal and the cuboid, no acute osseous processes noted.  Procedures performed:   None  Pertinent History, Exam, Impression, and Recommendations:   Problem List Items Addressed This Visit       Musculoskeletal and Integument   Peroneal tendinitis of left lower leg    Chronic condition with worsening symptoms over the past several days, denies any change in activity or injury at onset of worsening.  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  Pain to the lateral lower leg and lateral ankle to midfoot, aggravated with weightbearing, no paresthesias.  Examination shows focality to the peroneal longus and brevis both at the origin and at the insertion, given her persistent symptomatology despite recent initiation of diclofenac, nature of her work, we discussed  short-term immobilization in a short leg Cam boot, home-based rehab, and Rx management guidelines.  She will return in 1 month for reevaluation, suboptimal progress can be addressed with advanced imaging, formal PT, modification of medications.        Other   Arthralgia of left knee    Chronic, noted in the setting of concomitant ipsilateral left hip and left foot/ankle pain.  Fortunately recent x-rays are reassuring from intraoperative standpoint and her examination is otherwise nonfocal at the knee.  Her lateral knee pain most likely represents a sequela of IT band syndrome as well as peroneal tendinopathy proximally. See additional assessment(s) for plan details.      Other Visit Diagnoses     Peroneal tendinitis, left    -  Primary        Orders & Medications No orders of the defined types were placed in this encounter.  No orders of the defined types were placed in this encounter.    No follow-ups on file.     Montel Culver, MD   Primary Care Sports Medicine [place]

## 2022-09-11 ENCOUNTER — Ambulatory Visit
Admission: RE | Admit: 2022-09-11 | Discharge: 2022-09-11 | Disposition: A | Discharge status: Home / Self Care | Payer: BC Managed Care – PPO | Source: Ambulatory Visit | Attending: Internal Medicine | Admitting: Internal Medicine

## 2022-09-11 DIAGNOSIS — N6315 Unspecified lump in the right breast, overlapping quadrants: Secondary | ICD-10-CM | POA: Diagnosis not present

## 2022-09-11 DIAGNOSIS — R928 Other abnormal and inconclusive findings on diagnostic imaging of breast: Secondary | ICD-10-CM | POA: Insufficient documentation

## 2022-09-16 ENCOUNTER — Encounter: Payer: Self-pay | Admitting: Family Medicine

## 2022-09-17 ENCOUNTER — Ambulatory Visit: Payer: BC Managed Care – PPO | Admitting: Physical Therapy

## 2022-09-24 ENCOUNTER — Encounter: Payer: BC Managed Care – PPO | Admitting: Physical Therapy

## 2022-09-24 ENCOUNTER — Other Ambulatory Visit: Payer: Self-pay | Admitting: Family Medicine

## 2022-09-25 MED ORDER — METHOCARBAMOL 750 MG PO TABS: 750.0000 mg | ORAL_TABLET | Freq: Every evening | ORAL | 0 refills | Status: DC | PRN

## 2022-09-25 NOTE — Telephone Encounter (Signed)
Refill

## 2022-09-26 ENCOUNTER — Ambulatory Visit: Payer: Self-pay | Admitting: *Deleted

## 2022-09-26 NOTE — Telephone Encounter (Signed)
  Chief Complaint: rectal bleeding on and off since August.  History of hemorrhoids  Symptoms: Rectal bleeding with BMs.   Abd cramping when she needs to go.   Has problems with constipation so pushes to have a BM.    Mild dizziness when she stands up.   Frequency: Intermittently since Aug.   Happened in Sept and over this past weekend. Pertinent Negatives: Patient denies passing out.    Disposition: '[]'$ ED /'[]'$ Urgent Care (no appt availability in office) / '[x]'$ Appointment(In office/virtual)/ '[]'$  [facility]/ '[]'$ Home Care/ '[]'$ Refused Recommended Disposition /'[]'$ [facility]/ '[]'$  Follow-up with PCP Additional Notes: Appt made with Dr. Army Melia for 10/04/2022 at 3:40 with instructions to go to the ED if her symptoms become worse or she becomes lightheaded.   She verbalized understanding and agreement.      I put her on the wait list in hopes of getting her in sooner.

## 2022-09-26 NOTE — Telephone Encounter (Signed)
Called pt to schedule her a sooner appt. Offer pt an appt for tomorrow 10/13. Offered same day appointment slots for 10/16. Pt declined. Pt stated she can not come in until 10/20. Pt stated " I am not bleeding everyday."  KP

## 2022-09-26 NOTE — Telephone Encounter (Signed)
Reason for Disposition  MODERATE rectal bleeding (small blood clots, passing blood without stool, or toilet water turns red)  Answer Assessment - Initial Assessment Questions 1. APPEARANCE of BLOOD: "What color is it?" "Is it passed separately, on the surface of the stool, or mixed in with the stool?"      Having bright red rectal bleeding.   Aug. And Sept and today it's happened.   Over the past weekend my stomach was hurting like needed to have a BM.   Constipated but now it feels like I'm constipated but when U gave a BM it's bright red and loose.    2. AMOUNT: "How much blood was passed?"      The toilet water is very bright red.    3. FREQUENCY: "How many times has blood been passed with the stools?"      Aug once, Sept once and today and over the weekend. 4. ONSET: "When was the blood first seen in the stools?" (Days or weeks)      August 2023 5. DIARRHEA: "Is there also some diarrhea?" If Yes, ask: "How many diarrhea stools in the past 24 hours?"      Yes my stools are loose even though I have a history of constipation. 6. CONSTIPATION: "Do you have constipation?" If Yes, ask: "How bad is it?"     Sometimes. 7. RECURRENT SYMPTOMS: "Have you had blood in your stools before?" If Yes, ask: "When was the last time?" and "What happened that time?"      Yes 8. BLOOD THINNERS: "Do you take any blood thinners?" (e.g., Coumadin/warfarin, Pradaxa/dabigatran, aspirin)     No 9. OTHER SYMPTOMS: "Do you have any other symptoms?"  (e.g., abdomen pain, vomiting, dizziness, fever)     It cramps up like my stomach and I need to go bad.    10. PREGNANCY: "Is there any chance you are pregnant?" "When was your last menstrual period?"       Not asked  Protocols used: Rectal Bleeding-A-AH

## 2022-09-30 ENCOUNTER — Ambulatory Visit (INDEPENDENT_AMBULATORY_CARE_PROVIDER_SITE_OTHER): Payer: BC Managed Care – PPO | Admitting: Internal Medicine

## 2022-09-30 ENCOUNTER — Encounter: Payer: Self-pay | Admitting: Internal Medicine

## 2022-09-30 VITALS — BP 126/76 | HR 76 | Ht 66.0 in | Wt 218.0 lb

## 2022-09-30 DIAGNOSIS — K5904 Chronic idiopathic constipation: Secondary | ICD-10-CM | POA: Insufficient documentation

## 2022-09-30 NOTE — Patient Instructions (Signed)
Probiotic for the next month - longer if desired

## 2022-09-30 NOTE — Progress Notes (Signed)
Date:  09/30/2022   Name:  Jessica Robles   DOB:  [birth year]   MRN:  [id number]   Chief Complaint: Constipation (Started in August of this year. Told PCP about this on physical . Intermittent. Happens at least once a month. Red toilet water, blood on stool. Never had this until she had a colonoscopy. Constipation until she has diarrhea. )  Constipation This is a chronic (probably life long but somewhat worse over the past year) problem. The stool is described as firm and blood tinged. The patient is on a high fiber diet. She Exercises regularly. There has Been adequate water intake. Associated symptoms include abdominal pain (lower abdominal discomfort) and diarrhea (often after passing hard stool). Pertinent negatives include no fecal incontinence, hematochezia or melena. Treatments tried: currently taking Miralax daily and sometimes Dulcolax.  Rectal Bleeding  Episode onset: once a month since August. The problem has been unchanged. The patient is experiencing no pain. The stool is described as hard. Prior unsuccessful therapies include stool softeners and laxatives. Associated symptoms include abdominal pain (lower abdominal discomfort) and diarrhea (often after passing hard stool). Pertinent negatives include no chest pain.    Lab Results  Component Value Date   NA 143 08/06/2022   K 5.0 08/06/2022   CO2 22 08/06/2022   GLUCOSE 113 (H) 08/06/2022   BUN 28 (H) 08/06/2022   CREATININE 0.68 08/06/2022   CALCIUM 9.6 08/06/2022   EGFR 105 08/06/2022   Lab Results  Component Value Date   CHOL 236 (H) 08/06/2022   HDL 55 08/06/2022   LDLCALC 150 (H) 08/06/2022   TRIG 172 (H) 08/06/2022   CHOLHDL 4.3 08/06/2022   Lab Results  Component Value Date   TSH 1.710 07/18/2021   Lab Results  Component Value Date   HGBA1C 6.0 (H) 08/06/2022   Lab Results  Component Value Date   WBC 6.5 08/06/2022   HGB 14.2 08/06/2022   HCT 42.3 08/06/2022   MCV 89 08/06/2022   PLT 301 08/06/2022    Lab Results  Component Value Date   ALT 47 (H) 08/06/2022   AST 35 08/06/2022   ALKPHOS 96 08/06/2022   BILITOT 0.2 08/06/2022   No results found for: "25OHVITD2", "25OHVITD3", "VD25OH"   Review of Systems  Constitutional:  Negative for chills and fatigue.  Respiratory:  Negative for chest tightness and shortness of breath.   Cardiovascular:  Negative for chest pain.  Gastrointestinal:  Positive for abdominal pain (lower abdominal discomfort), constipation and diarrhea (often after passing hard stool). Negative for hematochezia and melena.  Psychiatric/Behavioral:  Negative for dysphoric mood and sleep disturbance. The patient is not nervous/anxious.     Patient Active Problem List   Diagnosis Date Noted   Greater trochanteric pain syndrome of left lower extremity 08/22/2022   Chronic bilateral low back pain with left-sided sciatica 08/22/2022   Arthralgia of left knee 08/22/2022   Peroneal tendinitis of left lower leg 08/22/2022   Abnormal mammogram of right breast 08/05/2022   Cervical radiculopathy 03/07/2021   Right elbow pain 03/07/2021    No Known Allergies  Past Surgical History:  Procedure Laterality Date   BREAST BIOPSY Right 10/04/2021   u/s bx, 9:00 "heart" clip-path pending   COLONOSCOPY N/A 08/17/2021   Procedure: COLONOSCOPY;  Surgeon: Jonathon Bellows, MD;  Location: [facility] ENDOSCOPY;  Service: Gastroenterology;  Laterality: N/A;    Social History   Tobacco Use   Smoking status: Former    Packs/day: 0.50  Years: 36.00    Total pack years: 18.00    Types: Cigarettes    Quit date: 09/15/2021    Years since quitting: 1.0   Smokeless tobacco: Never  Vaping Use   Vaping Use: Every day   Start date: 09/15/2021   Substances: Nicotine, Flavoring  Substance Use Topics   Alcohol use: Yes    Comment: occasional   Drug use: Never     Medication list has been reviewed and updated.  Current Meds  Medication Sig   diclofenac (VOLTAREN) 75 MG EC tablet Take  1 tablet (75 mg total) by mouth 2 (two) times daily.   gabapentin (NEURONTIN) 300 MG capsule Take 1 capsule (300 mg total) by mouth at bedtime.   methocarbamol (ROBAXIN) 750 MG tablet Take 1 tablet (750 mg total) by mouth at bedtime as needed for muscle spasms.       09/30/2022    9:04 AM 08/22/2022    1:52 PM 08/06/2022   10:41 AM 07/18/2021   10:53 AM  GAD 7 : Generalized Anxiety Score  Nervous, Anxious, on Edge 0  0 0  Control/stop worrying 0 0 0 0  Worry too much - different things 0 0 0 0  Trouble relaxing 0 0 0 0  Restless 0 0 0 0  Easily annoyed or irritable 1 0 0 1  Afraid - awful might happen 0 0 0 0  Total GAD 7 Score 1  0 1  Anxiety Difficulty Not difficult at all Not difficult at all Not difficult at all Not difficult at all       09/30/2022    9:03 AM 08/22/2022    1:52 PM 08/06/2022   10:40 AM  Depression screen PHQ 2/9  Decreased Interest 0 0 0  Down, Depressed, Hopeless 0 0 0  PHQ - 2 Score 0 0 0  Altered sleeping 1 2 3   Tired, decreased energy 0 1 3  Change in appetite 0 0 0  Feeling bad or failure about yourself  0 0 0  Trouble concentrating 0 0 0  Moving slowly or fidgety/restless 0 0 0  Suicidal thoughts 0 0 0  PHQ-9 Score 1 3 6   Difficult doing work/chores Not difficult at all Not difficult at all Not difficult at all    BP Readings from Last 3 Encounters:  09/30/22 126/76  08/29/22 114/82  08/22/22 128/88    Physical Exam Vitals and nursing note reviewed.  Constitutional:      General: She is not in acute distress.    Appearance: She is well-developed.  HENT:     Head: Normocephalic and atraumatic.  Cardiovascular:     Rate and Rhythm: Normal rate and regular rhythm.     Pulses: Normal pulses.  Pulmonary:     Effort: Pulmonary effort is normal. No respiratory distress.     Breath sounds: No wheezing or rhonchi.  Abdominal:     General: Abdomen is flat. Bowel sounds are normal.     Palpations: Abdomen is soft.     Tenderness: There is  abdominal tenderness in the suprapubic area. There is no guarding or rebound.  Musculoskeletal:     Cervical back: Normal range of motion.     Right lower leg: No edema.     Left lower leg: No edema.  Skin:    General: Skin is warm and dry.     Findings: No rash.  Neurological:     Mental Status: She is alert and oriented to person,  place, and time.  Psychiatric:        Mood and Affect: Mood normal.        Behavior: Behavior normal.     Wt Readings from Last 3 Encounters:  09/30/22 218 lb (98.9 kg)  08/29/22 220 lb (99.8 kg)  08/22/22 219 lb (99.3 kg)    BP 126/76   Pulse 76   Ht 5' 6"  (1.676 m)   Wt 218 lb (98.9 kg)   SpO2 97%   BMI 35.19 kg/m   Assessment and Plan: 1. Chronic idiopathic constipation May be IBS-C. Colonoscopy normal last year. No response to Miralax, Dulcolax other than episodic diarrhea Recommend daily probiotic Sample of Trulance 3 mg given - call for Rx if helpful for #90 with savings card (given) Follow up if bleeding becomes recurrent   Partially dictated using Dragon software. Any errors are unintentional.  Halina Maidens, MD [facility]  09/30/2022

## 2022-10-01 ENCOUNTER — Encounter: Payer: BC Managed Care – PPO | Admitting: Physical Therapy

## 2022-10-03 ENCOUNTER — Encounter: Payer: Self-pay | Admitting: Family Medicine

## 2022-10-03 ENCOUNTER — Inpatient Hospital Stay: Payer: Self-pay | Admitting: Radiology

## 2022-10-03 ENCOUNTER — Ambulatory Visit: Payer: BC Managed Care – PPO | Admitting: Family Medicine

## 2022-10-03 VITALS — BP 120/80 | HR 60 | Ht 66.0 in | Wt 224.0 lb

## 2022-10-03 DIAGNOSIS — G8929 Other chronic pain: Secondary | ICD-10-CM | POA: Diagnosis not present

## 2022-10-03 DIAGNOSIS — M25552 Pain in left hip: Secondary | ICD-10-CM | POA: Diagnosis not present

## 2022-10-03 DIAGNOSIS — M5442 Lumbago with sciatica, left side: Secondary | ICD-10-CM | POA: Diagnosis not present

## 2022-10-03 MED ORDER — TRIAMCINOLONE ACETONIDE 40 MG/ML IJ SUSP
40.0000 mg | Freq: Once | INTRAMUSCULAR | Status: AC
Start: 2022-10-03 — End: 2022-10-03
  Administered 2022-10-03: 40 mg via INTRAMUSCULAR

## 2022-10-03 MED ORDER — GABAPENTIN 300 MG PO CAPS: 300.0000 mg | ORAL_CAPSULE | Freq: Every day | ORAL | 1 refills | Status: DC

## 2022-10-03 MED ORDER — METHOCARBAMOL 750 MG PO TABS: 750.0000 mg | ORAL_TABLET | Freq: Every evening | ORAL | 1 refills | Status: DC | PRN

## 2022-10-03 MED ORDER — DICLOFENAC SODIUM 75 MG PO TBEC: 75.0000 mg | DELAYED_RELEASE_TABLET | Freq: Two times a day (BID) | ORAL | 1 refills | Status: DC | PRN

## 2022-10-03 NOTE — Assessment & Plan Note (Signed)
Again noted to be symptomatic with tenderness at the lateral hip/greater trochanter of the left leg.  That being said, this pain is secondary to left hip arthralgia. See additional assessment(s) for plan details.

## 2022-10-03 NOTE — Assessment & Plan Note (Signed)
Chronic, symptomatic, improved on gabapentin.  As such, continue gabapentin at current strength.

## 2022-10-03 NOTE — Patient Instructions (Addendum)
You have just been given a cortisone injection to reduce pain and inflammation. After the injection you may notice immediate relief of pain as a result of the Lidocaine. It is important to rest the area of the injection for 24 to 48 hours after the injection. There is a possibility of some temporary increased discomfort and swelling for up to 72 hours until the cortisone begins to work. If you do have pain, simply rest the joint and use ice. If you can tolerate over the counter medications, you can try Tylenol  for added relief per package instructions. - Relative rest x 2 days then gradual return to normal activity - Use diclofenac twice daily x 2 days then as-needed - Continue nightly gabapentin - Use methocarbamol nightly as-needed - Perform home exercises for hip and ankle - Contact us for any questions, follow-up as-needed

## 2022-10-03 NOTE — Assessment & Plan Note (Signed)
Patient presents with persistent left hip pain, chronic in nature, previously noted focality of symptoms about the greater trochanter on the left.  This in the setting of chronic bilateral low back pain with left-sided sciatica, left ankle chronic tendinopathy.  Her examination localizes to her left hip joint, she does have compensatory changes about the hip which are symptomatic.  Her reported pain is consistent with hip arthralgia.  Given her current medication regimen, persistent pain, we did discuss additional treatment strategies and she did elect to proceed with ultrasound-guided left hip joint injection.  Tolerated this well with immediate reports of pain resolution.  I encouraged patient to continue home exercises on a regular basis, follow-up as needed.  Diclofenac can be transition to as needed basis, continue nightly gabapentin, as needed methocarbamol.

## 2022-10-03 NOTE — Progress Notes (Signed)
Primary Care / Sports Medicine Office Visit  Patient Information:  Patient ID: Jessica Robles, female DOB: Jan 03, 1971 Age: 51 y.o. MRN: [id number]   Jessica Robles is a pleasant 51 y.o. female presenting with the following:  Chief Complaint  Patient presents with   Back Pain    Left lower   Hip Pain    Left hip not any better, ankle is better on medicaiton, aches all the time like a tooth ache    Vitals:   10/03/22 1317  BP: 120/80  Pulse: 60  SpO2: 99%   Vitals:   10/03/22 1317  Weight: 224 lb (101.6 kg)  Height: '5\' 6"'$  (1.676 m)   Body mass index is 36.15 kg/m.     Independent interpretation of notes and tests performed by another provider:   None  Procedures performed:   Procedure:  Injection of left hip joint under ultrasound guidance. Ultrasound guidance utilized for in-plane approach to the left femoral acetabular junction, no effusion noted, power Doppler utilized to ensure no vasculature in needle path Samsung HS60 device utilized with permanent recording / reporting. Verbal informed consent obtained and verified. Skin prepped in a sterile fashion. Ethyl chloride for topical local analgesia.  Completed without difficulty and tolerated well. Medication: triamcinolone acetonide 40 mg/mL suspension for injection 1 mL total and 2 mL lidocaine 1% without epinephrine utilized for needle placement anesthetic Advised to contact for fevers/chills, erythema, induration, drainage, or persistent bleeding.   Pertinent History, Exam, Impression, and Recommendations:   Problem List Items Addressed This Visit       Nervous and Auditory   Chronic bilateral low back pain with left-sided sciatica    Chronic, symptomatic, improved on gabapentin.  As such, continue gabapentin at current strength.      Relevant Medications   triamcinolone acetonide (KENALOG-40) injection 40 mg   diclofenac (VOLTAREN) 75 MG EC tablet   gabapentin (NEURONTIN) 300 MG capsule    methocarbamol (ROBAXIN) 750 MG tablet     Musculoskeletal and Integument   Greater trochanteric pain syndrome of left lower extremity    Again noted to be symptomatic with tenderness at the lateral hip/greater trochanter of the left leg.  That being said, this pain is secondary to left hip arthralgia. See additional assessment(s) for plan details.        Other   Arthralgia of left hip - Primary    Patient presents with persistent left hip pain, chronic in nature, previously noted focality of symptoms about the greater trochanter on the left.  This in the setting of chronic bilateral low back pain with left-sided sciatica, left ankle chronic tendinopathy.  Her examination localizes to her left hip joint, she does have compensatory changes about the hip which are symptomatic.  Her reported pain is consistent with hip arthralgia.  Given her current medication regimen, persistent pain, we did discuss additional treatment strategies and she did elect to proceed with ultrasound-guided left hip joint injection.  Tolerated this well with immediate reports of pain resolution.  I encouraged patient to continue home exercises on a regular basis, follow-up as needed.  Diclofenac can be transition to as needed basis, continue nightly gabapentin, as needed methocarbamol.      Relevant Medications   triamcinolone acetonide (KENALOG-40) injection 40 mg   Other Relevant Orders   [place] LIMITED JOINT SPACE STRUCTURES LOW LEFT     Orders & Medications Meds ordered this encounter  Medications   triamcinolone acetonide (KENALOG-40) injection 40 mg  diclofenac (VOLTAREN) 75 MG EC tablet    Sig: Take 1 tablet (75 mg total) by mouth 2 (two) times daily as needed.    Dispense:  60 tablet    Refill:  1   gabapentin (NEURONTIN) 300 MG capsule    Sig: Take 1 capsule (300 mg total) by mouth at bedtime.    Dispense:  30 capsule    Refill:  1   methocarbamol (ROBAXIN) 750 MG tablet    Sig: Take 1 tablet (750 mg total)  by mouth at bedtime as needed for muscle spasms.    Dispense:  30 tablet    Refill:  1   Orders Placed This Encounter  Procedures   [place] LIMITED JOINT SPACE STRUCTURES LOW LEFT     No follow-ups on file.     Montel Culver, MD   Primary Care Sports Medicine [place]

## 2022-10-04 ENCOUNTER — Ambulatory Visit: Payer: BC Managed Care – PPO | Admitting: Internal Medicine

## 2022-10-05 ENCOUNTER — Encounter: Payer: Self-pay | Admitting: Internal Medicine

## 2022-10-06 ENCOUNTER — Other Ambulatory Visit: Payer: Self-pay | Admitting: Internal Medicine

## 2022-10-06 DIAGNOSIS — K5904 Chronic idiopathic constipation: Secondary | ICD-10-CM

## 2022-10-06 MED ORDER — TRULANCE 3 MG PO TABS: 1.0000 | ORAL_TABLET | Freq: Every day | ORAL | 0 refills | Status: DC

## 2022-10-08 ENCOUNTER — Encounter: Payer: BC Managed Care – PPO | Admitting: Physical Therapy

## 2022-10-08 ENCOUNTER — Telehealth: Payer: Self-pay

## 2022-10-08 NOTE — Telephone Encounter (Signed)
Completed PA on [email or web address] for Trulance '3MG'$  tablets.  PA was APPROVED from 10/08/22-10/07/2023.  Informed patient through my chart message.

## 2022-10-15 ENCOUNTER — Encounter: Payer: BC Managed Care – PPO | Admitting: Physical Therapy

## 2022-10-22 ENCOUNTER — Encounter: Payer: BC Managed Care – PPO | Admitting: Physical Therapy

## 2022-10-24 ENCOUNTER — Other Ambulatory Visit: Payer: Self-pay | Admitting: Family Medicine

## 2022-10-24 NOTE — Telephone Encounter (Signed)
Requested medications are due for refill today.  no  Requested medications are on the active medications list.  yes  Last refill. 1019/2023 #30 1 rf  Future visit scheduled.   yes  Notes to clinic.  Refill not delegated    Requested Prescriptions  Pending Prescriptions Disp Refills   methocarbamol (ROBAXIN) 750 MG tablet [Pharmacy Med Name: METHOCARBAMOL 750 MG TABLET] 30 tablet 0    Sig: Take 1 tablet (750 mg total) by mouth at bedtime as needed for muscle spasms.     Not Delegated - Analgesics:  Muscle Relaxants Failed - 10/24/2022  2:18 PM      Failed - This refill cannot be delegated      Passed - Valid encounter within last 6 months    Recent Outpatient Visits           3 weeks ago Arthralgia of left hip   [place] [facility] [place], Earley Abide, MD   3 weeks ago Chronic idiopathic constipation   [place] [facility] at [facility], Jesse Sans, MD   1 month ago Peroneal tendinitis, left   [place] [facility] at [place], Earley Abide, MD   2 months ago Greater trochanteric pain syndrome of left lower extremity   [place] [facility] at [place], Earley Abide, MD   2 months ago Annual physical exam   [facility] at [facility], Jesse Sans, MD       Future Appointments             In 3 months Army Melia, Jesse Sans, MD [facility] at [facility], [facility]   In 9 months Army Melia, Jesse Sans, MD [place] [facility] at [facility], [facility]

## 2022-11-05 ENCOUNTER — Encounter: Payer: BC Managed Care – PPO | Admitting: Physical Therapy

## 2022-11-12 ENCOUNTER — Encounter: Payer: BC Managed Care – PPO | Admitting: Physical Therapy

## 2022-11-18 ENCOUNTER — Other Ambulatory Visit: Payer: Self-pay | Admitting: Internal Medicine

## 2022-11-18 ENCOUNTER — Other Ambulatory Visit: Payer: Self-pay

## 2022-11-18 ENCOUNTER — Encounter: Payer: Self-pay | Admitting: Internal Medicine

## 2022-11-18 DIAGNOSIS — K5904 Chronic idiopathic constipation: Secondary | ICD-10-CM

## 2022-11-18 MED ORDER — TRULANCE 3 MG PO TABS
1.0000 | ORAL_TABLET | Freq: Every day | ORAL | 0 refills | Status: DC
Start: 2022-11-18 — End: 2023-01-07

## 2022-11-18 NOTE — Telephone Encounter (Signed)
Pt response.  KP

## 2022-11-24 ENCOUNTER — Other Ambulatory Visit: Payer: Self-pay | Admitting: Internal Medicine

## 2022-11-24 DIAGNOSIS — K625 Hemorrhage of anus and rectum: Secondary | ICD-10-CM

## 2022-11-24 MED ORDER — HYDROCORTISONE ACE-PRAMOXINE 1-1 % EX CREA: 1.0000 | TOPICAL_CREAM | Freq: Two times a day (BID) | CUTANEOUS | 0 refills | Status: DC

## 2022-11-25 ENCOUNTER — Other Ambulatory Visit: Payer: Self-pay | Admitting: Internal Medicine

## 2022-11-25 DIAGNOSIS — K625 Hemorrhage of anus and rectum: Secondary | ICD-10-CM

## 2022-11-25 MED ORDER — HYDROCORTISONE (PERIANAL) 2.5 % EX CREA: 1.0000 | TOPICAL_CREAM | Freq: Two times a day (BID) | CUTANEOUS | 2 refills | Status: DC

## 2022-12-19 ENCOUNTER — Telehealth (INDEPENDENT_AMBULATORY_CARE_PROVIDER_SITE_OTHER): Payer: BC Managed Care – PPO | Admitting: Gastroenterology

## 2022-12-19 ENCOUNTER — Encounter: Payer: Self-pay | Admitting: Gastroenterology

## 2022-12-19 DIAGNOSIS — K625 Hemorrhage of anus and rectum: Secondary | ICD-10-CM | POA: Diagnosis not present

## 2022-12-19 NOTE — Progress Notes (Signed)
Jessica Sear, MD [address]  [place]  Jessica Robles, Jessica Robles [id number]  Main: [phone]  Fax: [phone]    Gastroenterology Consultation Video Visit  Referring Provider:     Glean Hess, MD Primary Care Physician:  Glean Hess, MD Primary Gastroenterologist:  Dr. Cephas Darby Reason for Consultation: Painless rectal bleeding        HPI:   Jessica Robles is a 52 y.o. female referred by Dr. Army Melia, Jesse Sans, MD  for consultation & management of bright red blood per rectum  Virtual Visit Video Note  I connected with Jessica Robles on 12/19/22 at  3:00 PM EST by video and verified that I am speaking with the correct person using two identifiers.   I discussed the limitations, risks, security and privacy concerns of performing an evaluation and management service by video and the availability of in person appointments. I also discussed with the patient that there may be a patient responsible charge related to this service. The patient expressed understanding and agreed to proceed.  Location of the Patient: Home  Location of the provider: Home office  Persons participating in the visit: Patient and provider only   History of Present Illness: Before initiating the visit, we have checked with patient if she would like to continue her care with Dr. Vicente Males because she underwent colonoscopy by him in 2022.  She wanted to be seen today by me.  52 year old pleasant female with history of chronic constipation on Trulance is seen in consultation for rectal bleeding.  Patient reports that she has been experiencing intermittent episodes of bright red blood per rectum without any rectal pain or discomfort since August 2023.  She reports mostly on wiping and sometimes dripping into the toilet bowel.  It is self-limited.  She tried Anusol cream prescribed by her PCP which helped and she did not have any bleeding in 2 weeks.  She has been taking Trulance 1 pill a day for the  last 2 months for chronic constipation, it helps to move her bowels daily but they are loose almost watery about 1-2 times daily.  She skips a dose if she is having too many bowel movements.  She denies any other symptoms.  NSAIDs: None  Antiplts/Anticoagulants/Anti thrombotics: None  GI Procedures:  Colonoscopy 08/17/2021 by Dr. Vicente Males for screening - One 10 mm polyp in the ascending colon, removed with a cold snare. Resected and retrieved. - One 7 mm polyp in the descending colon, removed with a cold snare. Resected and retrieved. - The examination was otherwise normal on direct and retroflexion views. DIAGNOSIS:  A. COLON POLYP, ASCENDING; COLD SNARE:  - SESSILE SERRATED POLYP.  - NEGATIVE FOR DYSPLASIA AND MALIGNANCY.   B. COLON POLYP, DESCENDING; COLD SNARE:  - HYPERPLASTIC POLYP.  - NEGATIVE FOR DYSPLASIA AND MALIGNANCY.   No past medical history on file.  Past Surgical History:  Procedure Laterality Date   BREAST BIOPSY Right 10/04/2021   u/s bx, 9:00 "heart" clip-path pending   COLONOSCOPY N/A 08/17/2021   Procedure: COLONOSCOPY;  Surgeon: Jonathon Bellows, MD;  Location: [facility] ENDOSCOPY;  Service: Gastroenterology;  Laterality: N/A;     Current Outpatient Medications:    diclofenac (VOLTAREN) 75 MG EC tablet, Take 1 tablet (75 mg total) by mouth 2 (two) times daily as needed., Disp: 60 tablet, Rfl: 1   gabapentin (NEURONTIN) 300 MG capsule, Take 1 capsule (300 mg total) by mouth at bedtime., Disp: 30 capsule, Rfl: 1  hydrocortisone (ANUSOL-HC) 2.5 % rectal cream, Place 1 Application rectally 2 (two) times daily., Disp: 30 g, Rfl: 2   methocarbamol (ROBAXIN) 750 MG tablet, Take 1 tablet (750 mg total) by mouth at bedtime as needed for muscle spasms., Disp: 30 tablet, Rfl: 1   Plecanatide (TRULANCE) 3 MG TABS, Take 1 tablet by mouth daily at 6 (six) AM., Disp: 30 tablet, Rfl: 0   Family History  Problem Relation Age of Onset   Cancer Mother        gall bladder with mets    Hypertension Father    Esophageal cancer Sister    Diabetes Maternal Grandmother    Heart disease Maternal Grandmother    Stroke Maternal Grandmother    Cancer Maternal Grandfather    Breast cancer Neg Hx      Social History   Tobacco Use   Smoking status: Former    Packs/day: 0.50    Years: 36.00    Total pack years: 18.00    Types: Cigarettes    Quit date: 09/15/2021    Years since quitting: 1.2   Smokeless tobacco: Never  Vaping Use   Vaping Use: Every day   Start date: 09/15/2021   Substances: Nicotine, Flavoring  Substance Use Topics   Alcohol use: Yes    Comment: occasional   Drug use: Never    Allergies as of 12/19/2022   (No Known Allergies)    Imaging Studies: No abdominal imaging  Assessment and Plan:   Marirose A Robles is a 52 y.o. female with history of prediabetes, chronic constipation currently maintained on Trulance is seen in consultation for approximately 5 months history of painless bright red blood per rectum, likely secondary to hemorrhoids.  Bleeding has temporarily subsided with Anusol cream.  Discussed with patient regarding long-term management of hemorrhoidal bleeding including outpatient hemorrhoid ligation or hemorrhoidectomy versus continuing topical hemorrhoidal creams as needed Patient would like to undergo hemorrhoid banding Discussed the procedure, risks and benefits Patient will schedule an office visit for hemorrhoid banding     Follow Up Instructions:   I discussed the assessment and treatment plan with the patient. The patient was provided an opportunity to ask questions and all were answered. The patient agreed with the plan and demonstrated an understanding of the instructions.   The patient was advised to call back or seek an in-person evaluation if the symptoms worsen or if the condition fails to improve as anticipated.  I provided 20 minutes of face-to-face time during this encounter.   Follow up in 1 to 2 weeks for  hemorrhoid banding   Cephas Darby, MD

## 2022-12-26 ENCOUNTER — Encounter: Payer: Self-pay | Admitting: Gastroenterology

## 2022-12-26 ENCOUNTER — Ambulatory Visit (INDEPENDENT_AMBULATORY_CARE_PROVIDER_SITE_OTHER): Payer: BC Managed Care – PPO | Admitting: Gastroenterology

## 2022-12-26 VITALS — BP 123/83 | HR 85 | Temp 98.3°F | Ht 66.0 in | Wt 222.4 lb

## 2022-12-26 DIAGNOSIS — K640 First degree hemorrhoids: Secondary | ICD-10-CM | POA: Diagnosis not present

## 2022-12-26 NOTE — Progress Notes (Signed)
Cephas Darby, MD [address]  [place]  [place], [place] [address]  Main: [phone]  Fax: [phone]    Gastroenterology Consultation  Referring Provider:     Glean Hess, MD Primary Care Physician:  Glean Hess, MD Primary Gastroenterologist:  Dr. Cephas Darby Reason for Consultation: Symptomatic hemorrhoids        HPI:   Jessica Robles is a 52 y.o. female referred by Dr. Army Melia, Jesse Sans, MD  for consultation & management of symptomatic hemorrhoids.  Patient was seen last week as a virtual visit and she is here for hemorrhoid banding today.  Patient is accompanied by her fianc  NSAIDs: None   Antiplts/Anticoagulants/Anti thrombotics: None   GI Procedures:  Colonoscopy 08/17/2021 by Dr. Vicente Males for screening - One 10 mm polyp in the ascending colon, removed with a cold snare. Resected and retrieved. - One 7 mm polyp in the descending colon, removed with a cold snare. Resected and retrieved. - The examination was otherwise normal on direct and retroflexion views. DIAGNOSIS:  A. COLON POLYP, ASCENDING; COLD SNARE:  - SESSILE SERRATED POLYP.  - NEGATIVE FOR DYSPLASIA AND MALIGNANCY.   B. COLON POLYP, DESCENDING; COLD SNARE:  - HYPERPLASTIC POLYP.  - NEGATIVE FOR DYSPLASIA AND MALIGNANCY.   History reviewed. No pertinent past medical history.  Past Surgical History:  Procedure Laterality Date   BREAST BIOPSY Right 10/04/2021   u/s bx, 9:00 "heart" clip-path pending   COLONOSCOPY N/A 08/17/2021   Procedure: COLONOSCOPY;  Surgeon: Jonathon Bellows, MD;  Location: [facility] ENDOSCOPY;  Service: Gastroenterology;  Laterality: N/A;     Current Outpatient Medications:    diclofenac (VOLTAREN) 75 MG EC tablet, Take 1 tablet (75 mg total) by mouth 2 (two) times daily as needed., Disp: 60 tablet, Rfl: 1   gabapentin (NEURONTIN) 300 MG capsule, Take 1 capsule (300 mg total) by mouth at bedtime., Disp: 30 capsule, Rfl: 1   hydrocortisone (ANUSOL-HC) 2.5 % rectal  cream, Place 1 Application rectally 2 (two) times daily., Disp: 30 g, Rfl: 2   methocarbamol (ROBAXIN) 750 MG tablet, Take 1 tablet (750 mg total) by mouth at bedtime as needed for muscle spasms., Disp: 30 tablet, Rfl: 1   Plecanatide (TRULANCE) 3 MG TABS, Take 1 tablet by mouth daily at 6 (six) AM., Disp: 30 tablet, Rfl: 0   Family History  Problem Relation Age of Onset   Cancer Mother        gall bladder with mets   Hypertension Father    Esophageal cancer Sister    Diabetes Maternal Grandmother    Heart disease Maternal Grandmother    Stroke Maternal Grandmother    Cancer Maternal Grandfather    Breast cancer Neg Hx      Social History   Tobacco Use   Smoking status: Former    Packs/day: 0.50    Years: 36.00    Total pack years: 18.00    Types: Cigarettes    Quit date: 09/15/2021    Years since quitting: 1.2   Smokeless tobacco: Never  Vaping Use   Vaping Use: Every day   Start date: 09/15/2021   Substances: Nicotine, Flavoring  Substance Use Topics   Alcohol use: Yes    Comment: occasional   Drug use: Never    Allergies as of 12/26/2022   (No Known Allergies)    Review of Systems:    All systems reviewed and negative except where noted in HPI.   Physical Exam:  BP 123/83 (BP Location: Left Arm, Patient Position: Sitting, Cuff Size: Normal)   Pulse 85   Temp 98.3 F (36.8 C) (Oral)   Ht '5\' 6"'$  (1.676 m)   Wt 222 lb 6 oz (100.9 kg)   BMI 35.89 kg/m  No LMP recorded. Patient is premenopausal.  General:   Alert,  Well-developed, well-nourished, pleasant and cooperative in NAD Head:  Normocephalic and atraumatic. Eyes:  Sclera clear, no icterus.   Conjunctiva pink. Ears:  Normal auditory acuity. Nose:  No deformity, discharge, or lesions. Mouth:  No deformity or lesions,oropharynx pink & moist. Neck:  Supple; no masses or thyromegaly. Lungs:  Respirations even and unlabored.  Clear throughout to auscultation.   No wheezes, crackles, or rhonchi. No acute  distress. Heart:  Regular rate and rhythm; no murmurs, clicks, rubs, or gallops. Abdomen:  Normal bowel sounds. Soft, non-tender and non-distended without masses, hepatosplenomegaly or hernias noted.  No guarding or rebound tenderness.   Rectal: Not performed Msk:  Symmetrical without gross deformities. Good, equal movement & strength bilaterally. Pulses:  Normal pulses noted. Extremities:  No clubbing or edema.  No cyanosis. Neurologic:  Alert and oriented x3;  grossly normal neurologically. Skin:  Intact without significant lesions or rashes. No jaundice. Psych:  Alert and cooperative. Normal mood and affect.  Imaging Studies: No abdominal imaging  Assessment and Plan:   Jessica Robles is a 52 y.o. female with history of prediabetes, chronic constipation currently maintained on Trulance is seen in consultation for approximately 5 months history of painless bright red blood per rectum, likely secondary to hemorrhoids.  Bleeding has temporarily subsided with Anusol cream.  Discussed with patient regarding long-term management of hemorrhoidal bleeding including outpatient hemorrhoid ligation or hemorrhoidectomy versus continuing topical hemorrhoidal creams as needed Patient would like to undergo hemorrhoid banding Discussed the procedure, risks and benefits Proceed with hemorrhoid ligation today  Follow up in 2 weeks   Cephas Darby, MD

## 2022-12-26 NOTE — Progress Notes (Signed)

## 2023-01-03 ENCOUNTER — Other Ambulatory Visit: Payer: Self-pay | Admitting: Family Medicine

## 2023-01-03 ENCOUNTER — Other Ambulatory Visit: Payer: Self-pay

## 2023-01-03 ENCOUNTER — Encounter: Payer: Self-pay | Admitting: Family Medicine

## 2023-01-03 DIAGNOSIS — G8929 Other chronic pain: Secondary | ICD-10-CM

## 2023-01-03 DIAGNOSIS — M25552 Pain in left hip: Secondary | ICD-10-CM

## 2023-01-03 DIAGNOSIS — M25572 Pain in left ankle and joints of left foot: Secondary | ICD-10-CM

## 2023-01-03 MED ORDER — GABAPENTIN 300 MG PO CAPS: 300.0000 mg | ORAL_CAPSULE | Freq: Every day | ORAL | 0 refills | Status: DC

## 2023-01-03 MED ORDER — DICLOFENAC SODIUM 75 MG PO TBEC: 75.0000 mg | DELAYED_RELEASE_TABLET | Freq: Two times a day (BID) | ORAL | 0 refills | Status: DC | PRN

## 2023-01-03 MED ORDER — METHOCARBAMOL 750 MG PO TABS: 750.0000 mg | ORAL_TABLET | Freq: Every evening | ORAL | 0 refills | Status: DC | PRN

## 2023-01-03 NOTE — Telephone Encounter (Signed)
Pt sent 2 messages

## 2023-01-03 NOTE — Telephone Encounter (Signed)
Please advise 

## 2023-01-06 NOTE — Telephone Encounter (Signed)
Refilled 12/29/22 # 30.

## 2023-01-07 ENCOUNTER — Other Ambulatory Visit: Payer: Self-pay | Admitting: Internal Medicine

## 2023-01-07 DIAGNOSIS — K5904 Chronic idiopathic constipation: Secondary | ICD-10-CM

## 2023-01-07 MED ORDER — TRULANCE 3 MG PO TABS: 1.0000 | ORAL_TABLET | Freq: Every day | ORAL | 5 refills | Status: DC

## 2023-01-14 ENCOUNTER — Ambulatory Visit (INDEPENDENT_AMBULATORY_CARE_PROVIDER_SITE_OTHER): Payer: BC Managed Care – PPO | Admitting: Gastroenterology

## 2023-01-14 ENCOUNTER — Encounter: Payer: Self-pay | Admitting: Gastroenterology

## 2023-01-14 VITALS — BP 139/85 | HR 83 | Temp 97.6°F | Ht 66.0 in | Wt 225.2 lb

## 2023-01-14 DIAGNOSIS — K5909 Other constipation: Secondary | ICD-10-CM | POA: Diagnosis not present

## 2023-01-14 DIAGNOSIS — K640 First degree hemorrhoids: Secondary | ICD-10-CM | POA: Diagnosis not present

## 2023-01-14 DIAGNOSIS — K625 Hemorrhage of anus and rectum: Secondary | ICD-10-CM | POA: Diagnosis not present

## 2023-01-14 MED ORDER — LINACLOTIDE 145 MCG PO CAPS: 145.0000 ug | ORAL_CAPSULE | Freq: Every day | ORAL | 0 refills | Status: DC

## 2023-01-14 NOTE — Progress Notes (Signed)
Jessica Darby, MD [address]  [place]  [place], [place] [address]  Main: [phone]  Fax: [phone]    Gastroenterology Consultation  Referring Provider:     Glean Hess, MD Primary Care Physician:  Glean Hess, MD Primary Gastroenterologist:  Dr. Cephas Robles Reason for Consultation: Symptomatic hemorrhoids and chronic constipation        HPI:   Jessica Robles is a 52 y.o. female referred by Dr. Army Melia, Jesse Sans, MD  for consultation & management of symptomatic hemorrhoids.  Patient was seen last week as a virtual visit and she is here for hemorrhoid banding today.  Patient is accompanied by her fianc  Follow-up visit 01/14/2023 Patient is here for follow-up of symptomatic hemorrhoids and chronic constipation.  She underwent hemorrhoid ligation during last visit, denies any further episodes of rectal bleeding.  She continues to have constipation, currently on Trulance 3 mg daily.  She reports that Trulance is not helping her consistently.  When she takes Trulance, there are days where she has severe diarrhea and then she skips the dose.  She admits to eating good amount of fiber and drinks adequate amount of water.  NSAIDs: None   Antiplts/Anticoagulants/Anti thrombotics: None   GI Procedures:  Colonoscopy 08/17/2021 by Dr. Vicente Males for screening - One 10 mm polyp in the ascending colon, removed with a cold snare. Resected and retrieved. - One 7 mm polyp in the descending colon, removed with a cold snare. Resected and retrieved. - The examination was otherwise normal on direct and retroflexion views. DIAGNOSIS:  A. COLON POLYP, ASCENDING; COLD SNARE:  - SESSILE SERRATED POLYP.  - NEGATIVE FOR DYSPLASIA AND MALIGNANCY.   B. COLON POLYP, DESCENDING; COLD SNARE:  - HYPERPLASTIC POLYP.  - NEGATIVE FOR DYSPLASIA AND MALIGNANCY.   History reviewed. No pertinent past medical history.  Past Surgical History:  Procedure Laterality Date   BREAST BIOPSY  Right 10/04/2021   u/s bx, 9:00 "heart" clip-path pending   COLONOSCOPY N/A 08/17/2021   Procedure: COLONOSCOPY;  Surgeon: Jonathon Bellows, MD;  Location: [facility] ENDOSCOPY;  Service: Gastroenterology;  Laterality: N/A;     Current Outpatient Medications:    diclofenac (VOLTAREN) 75 MG EC tablet, Take 1 tablet (75 mg total) by mouth 2 (two) times daily as needed., Disp: 60 tablet, Rfl: 0   gabapentin (NEURONTIN) 300 MG capsule, Take 1 capsule (300 mg total) by mouth at bedtime., Disp: 30 capsule, Rfl: 0   hydrocortisone (ANUSOL-HC) 2.5 % rectal cream, Place 1 Application rectally 2 (two) times daily., Disp: 30 g, Rfl: 2   linaclotide (LINZESS) 145 MCG CAPS capsule, Take 1 capsule (145 mcg total) by mouth daily before breakfast., Disp: 30 capsule, Rfl: 0   methocarbamol (ROBAXIN) 750 MG tablet, Take 1 tablet (750 mg total) by mouth at bedtime as needed for muscle spasms., Disp: 30 tablet, Rfl: 0   Family History  Problem Relation Age of Onset   Cancer Mother        gall bladder with mets   Hypertension Father    Esophageal cancer Sister    Diabetes Maternal Grandmother    Heart disease Maternal Grandmother    Stroke Maternal Grandmother    Cancer Maternal Grandfather    Breast cancer Neg Hx      Social History   Tobacco Use   Smoking status: Former    Packs/day: 0.50    Years: 36.00    Total pack years: 18.00    Types: Cigarettes  Quit date: 09/15/2021    Years since quitting: 1.3   Smokeless tobacco: Never  Vaping Use   Vaping Use: Every day   Start date: 09/15/2021   Substances: Nicotine, Flavoring  Substance Use Topics   Alcohol use: Yes    Comment: occasional   Drug use: Never    Allergies as of 01/14/2023   (No Known Allergies)    Review of Systems:    All systems reviewed and negative except where noted in HPI.   Physical Exam:  BP 139/85 (BP Location: Right Arm, Patient Position: Sitting, Cuff Size: Normal)   Pulse 83   Temp 97.6 F (36.4 C) (Oral)   Ht 5'  6" (1.676 m)   Wt 225 lb 4 oz (102.2 kg)   BMI 36.36 kg/m  No LMP recorded. Patient is premenopausal.  General:   Alert,  Well-developed, well-nourished, pleasant and cooperative in NAD Head:  Normocephalic and atraumatic. Eyes:  Sclera clear, no icterus.   Conjunctiva pink. Ears:  Normal auditory acuity. Nose:  No deformity, discharge, or lesions. Mouth:  No deformity or lesions,oropharynx pink & moist. Neck:  Supple; no masses or thyromegaly. Lungs:  Respirations even and unlabored.  Clear throughout to auscultation.   No wheezes, crackles, or rhonchi. No acute distress. Heart:  Regular rate and rhythm; no murmurs, clicks, rubs, or gallops. Abdomen:  Normal bowel sounds. Soft, non-tender and non-distended without masses, hepatosplenomegaly or hernias noted.  No guarding or rebound tenderness.   Rectal: Nontender digital rectal exam, normal perianal exam Msk:  Symmetrical without gross deformities. Good, equal movement & strength bilaterally. Pulses:  Normal pulses noted. Extremities:  No clubbing or edema.  No cyanosis. Neurologic:  Alert and oriented x3;  grossly normal neurologically. Skin:  Intact without significant lesions or rashes. No jaundice. Psych:  Alert and cooperative. Normal mood and affect.  Imaging Studies: No abdominal imaging  Assessment and Plan:   [name] is a 52 y.o. female with history of prediabetes, chronic constipation currently maintained on Trulance is seen in for follow-up of painless bright red blood per rectum, likely secondary to hemorrhoids.  Bleeding has temporarily subsided with Anusol cream.  Discussed with patient regarding long-term management of hemorrhoidal bleeding including outpatient hemorrhoid ligation or hemorrhoidectomy versus continuing topical hemorrhoidal creams as needed Patient would like to undergo hemorrhoid banding Discussed the procedure, risks and benefits Proceed with hemorrhoid ligation today  Chronic  constipation Trulance is not helping consistently, resulting in diarrhea are not having any bowel movements Discussed about Linzess as the next choice, will try 145 mcg daily, prescription sent  Follow up in 2 weeks   Jessica Darby, MD

## 2023-01-14 NOTE — Progress Notes (Deleted)

## 2023-01-14 NOTE — Progress Notes (Signed)

## 2023-01-31 ENCOUNTER — Ambulatory Visit: Payer: BC Managed Care – PPO | Admitting: Family Medicine

## 2023-01-31 ENCOUNTER — Encounter: Payer: Self-pay | Admitting: Family Medicine

## 2023-01-31 VITALS — BP 128/82 | HR 80 | Ht 66.0 in | Wt 212.0 lb

## 2023-01-31 DIAGNOSIS — M25572 Pain in left ankle and joints of left foot: Secondary | ICD-10-CM

## 2023-01-31 DIAGNOSIS — G8929 Other chronic pain: Secondary | ICD-10-CM | POA: Diagnosis not present

## 2023-01-31 DIAGNOSIS — M5442 Lumbago with sciatica, left side: Secondary | ICD-10-CM | POA: Diagnosis not present

## 2023-01-31 DIAGNOSIS — M25552 Pain in left hip: Secondary | ICD-10-CM | POA: Diagnosis not present

## 2023-02-03 ENCOUNTER — Encounter: Payer: Self-pay | Admitting: Family Medicine

## 2023-02-03 ENCOUNTER — Other Ambulatory Visit: Payer: Self-pay | Admitting: Family Medicine

## 2023-02-03 ENCOUNTER — Telehealth: Payer: Self-pay | Admitting: Internal Medicine

## 2023-02-03 DIAGNOSIS — G8929 Other chronic pain: Secondary | ICD-10-CM

## 2023-02-03 DIAGNOSIS — M25572 Pain in left ankle and joints of left foot: Secondary | ICD-10-CM

## 2023-02-03 DIAGNOSIS — M25552 Pain in left hip: Secondary | ICD-10-CM

## 2023-02-03 MED ORDER — METHOCARBAMOL 750 MG PO TABS: 750.0000 mg | ORAL_TABLET | Freq: Every evening | ORAL | 0 refills | Status: DC | PRN

## 2023-02-03 MED ORDER — DULOXETINE HCL 30 MG PO CPEP: ORAL_CAPSULE | ORAL | 0 refills | Status: DC

## 2023-02-03 MED ORDER — GABAPENTIN 300 MG PO CAPS: 300.0000 mg | ORAL_CAPSULE | Freq: Two times a day (BID) | ORAL | 0 refills | Status: DC

## 2023-02-03 MED ORDER — DICLOFENAC SODIUM 75 MG PO TBEC: 75.0000 mg | DELAYED_RELEASE_TABLET | Freq: Two times a day (BID) | ORAL | 0 refills | Status: DC | PRN

## 2023-02-03 NOTE — Telephone Encounter (Signed)
Please advise 

## 2023-02-03 NOTE — Telephone Encounter (Signed)
Copied from [place] [id number]. Topic: General - Inquiry >> Feb 03, 2023 10:58 AM Penni Bombard wrote: Reason for CRM: pt called saying she was in last week to see Dr, Zigmund Daniel and was under the impression he was going to send her some medications to the pharmacy.  She said she has not had anything sent in yet.  [phone]

## 2023-02-04 NOTE — Assessment & Plan Note (Signed)
Acute on chronic recurrence, had excellent response from corticosteroid injection done previously 4 months prior.  Does also endorse some radiating pain to the ipsilateral toes, paresthesias, no posterolateral hip pain.  Examination with positive FADIR recreating symptoms, nontender SI joint and greater trochanter, equivocal/positive modified/seated straight leg raise.  Plan for medication management with Neurontin dosing, diclofenac, initiation of duloxetine, close follow-up with low threshold to advance to intra-articular corticosteroid injection.

## 2023-02-04 NOTE — Progress Notes (Signed)
     Primary Care / Sports Medicine Office Visit  Patient Information:  Patient ID: Jessica Robles, female DOB: [birth year] Age: 52 y.o. MRN: [id number]   Jessica Robles is a pleasant 52 y.o. female presenting with the following:  Chief Complaint  Patient presents with   Hip Pain    Follow up from 4 months ago. Hip started hurting again 1 month ago. Left.    Vitals:   01/31/23 1326  BP: 128/82  Pulse: 80  SpO2: 95%   Vitals:   01/31/23 1326  Weight: 212 lb (96.2 kg)  Height: 5' 6"$  (1.676 m)   Body mass index is 34.22 kg/m.  No results found.   Independent interpretation of notes and tests performed by another provider:   None  Procedures performed:   None  Pertinent History, Exam, Impression, and Recommendations:   Jessica Robles was seen today for hip pain.  Arthralgia of left hip Assessment & Plan: Acute on chronic recurrence, had excellent response from corticosteroid injection done previously 4 months prior.  Does also endorse some radiating pain to the ipsilateral toes, paresthesias, no posterolateral hip pain.  Examination with positive FADIR recreating symptoms, nontender SI joint and greater trochanter, equivocal/positive modified/seated straight leg raise.  Plan for medication management with Neurontin dosing, diclofenac, initiation of duloxetine, close follow-up with low threshold to advance to intra-articular corticosteroid injection.  Orders: -     Gabapentin; Take 1 capsule (300 mg total) by mouth 2 (two) times daily.  Dispense: 60 capsule; Refill: 0  Acute left ankle pain -     Gabapentin; Take 1 capsule (300 mg total) by mouth 2 (two) times daily.  Dispense: 60 capsule; Refill: 0  Chronic bilateral low back pain with left-sided sciatica -     Ambulatory referral to Pain Clinic -     Gabapentin; Take 1 capsule (300 mg total) by mouth 2 (two) times daily.  Dispense: 60 capsule; Refill: 0  Greater trochanteric pain syndrome of left lower extremity -      Gabapentin; Take 1 capsule (300 mg total) by mouth 2 (two) times daily.  Dispense: 60 capsule; Refill: 0     Orders & Medications Meds ordered this encounter  Medications   gabapentin (NEURONTIN) 300 MG capsule    Sig: Take 1 capsule (300 mg total) by mouth 2 (two) times daily.    Dispense:  60 capsule    Refill:  0   Orders Placed This Encounter  Procedures   Ambulatory referral to Pain Clinic     No follow-ups on file.     Montel Culver, MD, [facility]   Primary Care Sports Medicine Primary Care and Sports Medicine at [facility]

## 2023-02-10 ENCOUNTER — Encounter: Payer: Self-pay | Admitting: Internal Medicine

## 2023-02-10 ENCOUNTER — Ambulatory Visit: Payer: BC Managed Care – PPO | Admitting: Internal Medicine

## 2023-02-10 VITALS — BP 120/78 | HR 64 | Ht 66.0 in | Wt 225.0 lb

## 2023-02-10 DIAGNOSIS — R7303 Prediabetes: Secondary | ICD-10-CM

## 2023-02-10 DIAGNOSIS — L609 Nail disorder, unspecified: Secondary | ICD-10-CM

## 2023-02-10 DIAGNOSIS — E782 Mixed hyperlipidemia: Secondary | ICD-10-CM | POA: Diagnosis not present

## 2023-02-10 DIAGNOSIS — Z6836 Body mass index (BMI) 36.0-36.9, adult: Secondary | ICD-10-CM

## 2023-02-10 LAB — POCT GLYCOSYLATED HEMOGLOBIN (HGB A1C): Hemoglobin A1C: 5.4 % (ref 4.0–5.6)

## 2023-02-10 NOTE — Assessment & Plan Note (Signed)
She is very discouraged with the lack of weight loss She has tried multiple diets in the past without success She did lose about 20 lb on phentermine at the bariatric clinic but gained it all back once she discontinued She will check insurance coverage on GLP-1s In the meantime, continue diet changes

## 2023-02-10 NOTE — Progress Notes (Signed)
Date:  02/10/2023   Name:  Jessica Robles   DOB:  [birth year]   MRN:  [id number]   Chief Complaint: Diabetes and Hypertension  Diabetes She presents for her follow-up diabetic visit. Diabetes type: prediabetes. Her disease course has been improving. Pertinent negatives for hypoglycemia include no dizziness, headaches or nervousness/anxiousness. Pertinent negatives for diabetes include no chest pain. Symptoms are improving. Current diabetic treatment includes diet. She is compliant with treatment most of the time.  Hyperlipidemia The problem is uncontrolled. Recent lipid tests were reviewed and are high. Pertinent negatives include no chest pain or shortness of breath. Current antihyperlipidemic treatment includes exercise and diet change.    Lab Results  Component Value Date   NA 143 08/06/2022   K 5.0 08/06/2022   CO2 22 08/06/2022   GLUCOSE 113 (H) 08/06/2022   BUN 28 (H) 08/06/2022   CREATININE 0.68 08/06/2022   CALCIUM 9.6 08/06/2022   EGFR 105 08/06/2022   Lab Results  Component Value Date   CHOL 236 (H) 08/06/2022   HDL 55 08/06/2022   LDLCALC 150 (H) 08/06/2022   TRIG 172 (H) 08/06/2022   CHOLHDL 4.3 08/06/2022   Lab Results  Component Value Date   TSH 1.710 07/18/2021   Lab Results  Component Value Date   HGBA1C 5.4 02/10/2023   Lab Results  Component Value Date   WBC 6.5 08/06/2022   HGB 14.2 08/06/2022   HCT 42.3 08/06/2022   MCV 89 08/06/2022   PLT 301 08/06/2022   Lab Results  Component Value Date   ALT 47 (H) 08/06/2022   AST 35 08/06/2022   ALKPHOS 96 08/06/2022   BILITOT 0.2 08/06/2022   No results found for: "25OHVITD2", "25OHVITD3", "VD25OH"   Review of Systems  Constitutional:  Negative for chills, diaphoresis and unexpected weight change.  Eyes:  Negative for visual disturbance.  Respiratory:  Negative for chest tightness and shortness of breath.   Cardiovascular:  Negative for chest pain and leg swelling.  Neurological:  Negative for  dizziness and headaches.  Psychiatric/Behavioral:  Negative for dysphoric mood and sleep disturbance. The patient is not nervous/anxious.     Patient Active Problem List   Diagnosis Date Noted   Hyperlipidemia, mixed 02/10/2023   BMI 36.0-36.9,adult 02/10/2023   Arthralgia of left hip 10/03/2022   Chronic idiopathic constipation 09/30/2022   Greater trochanteric pain syndrome of left lower extremity 08/22/2022   Chronic bilateral low back pain with left-sided sciatica 08/22/2022   Arthralgia of left knee 08/22/2022   Peroneal tendinitis of left lower leg 08/22/2022   Abnormal mammogram of right breast 08/05/2022   Cervical radiculopathy 03/07/2021    No Known Allergies  Past Surgical History:  Procedure Laterality Date   BREAST BIOPSY Right 10/04/2021   u/s bx, 9:00 "heart" clip-path pending   COLONOSCOPY N/A 08/17/2021   Procedure: COLONOSCOPY;  Surgeon: Jonathon Bellows, MD;  Location: [facility] ENDOSCOPY;  Service: Gastroenterology;  Laterality: N/A;    Social History   Tobacco Use   Smoking status: Former    Packs/day: 0.50    Years: 36.00    Total pack years: 18.00    Types: Cigarettes    Quit date: 09/15/2021    Years since quitting: 1.4   Smokeless tobacco: Never  Vaping Use   Vaping Use: Every day   Start date: 09/15/2021   Substances: Nicotine, Flavoring  Substance Use Topics   Alcohol use: Yes    Comment: occasional   Drug use: Never  Medication list has been reviewed and updated.  Current Meds  Medication Sig   diclofenac (VOLTAREN) 75 MG EC tablet Take 1 tablet (75 mg total) by mouth 2 (two) times daily as needed.   DULoxetine (CYMBALTA) 30 MG capsule Take 1 capsule (30 mg total) by mouth every evening for 14 days, THEN 2 capsules (60 mg total) every evening.   gabapentin (NEURONTIN) 300 MG capsule Take 1 capsule (300 mg total) by mouth 2 (two) times daily.   hydrocortisone (ANUSOL-HC) 2.5 % rectal cream Place 1 Application rectally 2 (two) times daily.    methocarbamol (ROBAXIN) 750 MG tablet Take 1 tablet (750 mg total) by mouth at bedtime as needed for muscle spasms.   Plecanatide (TRULANCE) 3 MG TABS Take by mouth.       02/10/2023    1:20 PM 09/30/2022    9:04 AM 08/22/2022    1:52 PM 08/06/2022   10:41 AM  GAD 7 : Generalized Anxiety Score  Nervous, Anxious, on Edge 0 0  0  Control/stop worrying 0 0 0 0  Worry too much - different things 0 0 0 0  Trouble relaxing 0 0 0 0  Restless 0 0 0 0  Easily annoyed or irritable 0 1 0 0  Afraid - awful might happen 0 0 0 0  Total GAD 7 Score 0 1  0  Anxiety Difficulty Not difficult at all Not difficult at all Not difficult at all Not difficult at all       02/10/2023    1:20 PM 09/30/2022    9:03 AM 08/22/2022    1:52 PM  Depression screen PHQ 2/9  Decreased Interest 0 0 0  Down, Depressed, Hopeless 0 0 0  PHQ - 2 Score 0 0 0  Altered sleeping '3 1 2  '$ Tired, decreased energy 0 0 1  Change in appetite 0 0 0  Feeling bad or failure about yourself  0 0 0  Trouble concentrating 0 0 0  Moving slowly or fidgety/restless 0 0 0  Suicidal thoughts 0 0 0  PHQ-9 Score '3 1 3  '$ Difficult doing work/chores Not difficult at all Not difficult at all Not difficult at all    BP Readings from Last 3 Encounters:  02/10/23 120/78  01/31/23 128/82  01/14/23 139/85    Physical Exam Vitals and nursing note reviewed.  Constitutional:      General: She is not in acute distress.    Appearance: Normal appearance. She is well-developed.  HENT:     Head: Normocephalic and atraumatic.  Cardiovascular:     Rate and Rhythm: Normal rate and regular rhythm.  Pulmonary:     Effort: Pulmonary effort is normal. No respiratory distress.     Breath sounds: No wheezing or rhonchi.  Musculoskeletal:     Cervical back: Normal range of motion.     Right lower leg: No edema.     Left lower leg: No edema.  Lymphadenopathy:     Cervical: No cervical adenopathy.  Skin:    General: Skin is warm and dry.      Findings: No rash.  Neurological:     Mental Status: She is alert and oriented to person, place, and time.  Psychiatric:        Mood and Affect: Mood normal.        Behavior: Behavior normal.     Wt Readings from Last 3 Encounters:  02/10/23 225 lb (102.1 kg)  01/31/23 212 lb (96.2 kg)  01/14/23 225 lb 4 oz (102.2 kg)    BP 120/78   Pulse 64   Ht '5\' 6"'$  (1.676 m)   Wt 225 lb (102.1 kg)   SpO2 95%   BMI 36.32 kg/m   Assessment and Plan: Problem List Items Addressed This Visit       Other   BMI 36.0-36.9,adult    She is very discouraged with the lack of weight loss She has tried multiple diets in the past without success She did lose about 20 lb on phentermine at the bariatric clinic but gained it all back once she discontinued She will check insurance coverage on GLP-1s In the meantime, continue diet changes      Hyperlipidemia, mixed    Should be improved with diet changes Low 10 yr risk so medications not indicated at this time       Relevant Orders   Lipid panel   Other Visit Diagnoses     Prediabetes    -  Primary   A1C has come down from 6.0 to 5.4 now normal - pt congratulated continue dietary changes   Relevant Orders   POCT glycosylated hemoglobin (Hb A1C) (Completed)   Nail abnormality       appears to be mild fungal involvement on great toe nails        Partially dictated using Editor, commissioning. Any errors are unintentional.  Halina Maidens, MD [facility]  02/10/2023

## 2023-02-10 NOTE — Patient Instructions (Addendum)
Kirke Shaggy - inject once a day Wegovy - once a week Zepbound - once a week

## 2023-02-10 NOTE — Assessment & Plan Note (Signed)
Should be improved with diet changes Low 10 yr risk so medications not indicated at this time

## 2023-02-11 LAB — LIPID PANEL
Chol/HDL Ratio: 3.5 ratio (ref 0.0–4.4)
Cholesterol, Total: 191 mg/dL (ref 100–199)
HDL: 54 mg/dL (ref >39)
LDL Chol Calc (NIH): 120 mg/dL — ABNORMAL HIGH (ref 0–99)
Triglycerides: 96 mg/dL (ref 0–149)
VLDL Cholesterol Cal: 17 mg/dL (ref 5–40)

## 2023-02-12 ENCOUNTER — Encounter: Payer: Self-pay | Admitting: Internal Medicine

## 2023-02-12 ENCOUNTER — Ambulatory Visit (INDEPENDENT_AMBULATORY_CARE_PROVIDER_SITE_OTHER): Payer: BC Managed Care – PPO | Admitting: Gastroenterology

## 2023-02-12 ENCOUNTER — Other Ambulatory Visit: Payer: Self-pay | Admitting: Internal Medicine

## 2023-02-12 ENCOUNTER — Encounter: Payer: Self-pay | Admitting: Gastroenterology

## 2023-02-12 VITALS — BP 129/85 | HR 80 | Temp 98.8°F | Ht 66.0 in | Wt 224.1 lb

## 2023-02-12 DIAGNOSIS — K5909 Other constipation: Secondary | ICD-10-CM | POA: Diagnosis not present

## 2023-02-12 DIAGNOSIS — Z6836 Body mass index (BMI) 36.0-36.9, adult: Secondary | ICD-10-CM

## 2023-02-12 DIAGNOSIS — K640 First degree hemorrhoids: Secondary | ICD-10-CM

## 2023-02-12 MED ORDER — WEGOVY 0.25 MG/0.5ML ~~LOC~~ SOAJ: 0.2500 mg | SUBCUTANEOUS | 0 refills | Status: DC

## 2023-02-12 NOTE — Progress Notes (Signed)
Cephas Darby, MD [address]  [place], [place] [address]  Main: [phone]  Fax: [phone]    Gastroenterology Consultation  Referring Provider:     Glean Hess, MD Primary Care Physician:  Glean Hess, MD Primary Gastroenterologist:  Dr. Cephas Darby Reason for Consultation: Symptomatic hemorrhoids and chronic constipation        HPI:   Jessica Robles is a 52 y.o. female referred by Dr. Army Melia, Jesse Sans, MD  for consultation & management of symptomatic hemorrhoids.  Patient was seen last week as a virtual visit and she is here for hemorrhoid banding today.  Patient is accompanied by her fianc  Follow-up visit 01/14/2023 Patient is here for follow-up of symptomatic hemorrhoids and chronic constipation.  She underwent hemorrhoid ligation during last visit, denies any further episodes of rectal bleeding.  She continues to have constipation, currently on Trulance 3 mg daily.  She reports that Trulance is not helping her consistently.  When she takes Trulance, there are days where she has severe diarrhea and then she skips the dose.  She admits to eating good amount of fiber and drinks adequate amount of water.  Follow-up visit 02/12/2023 Patient is here for follow-up of hemorrhoid banding.  Her constipation is fairly under control.  Her insurance did not cover [place].  Continues to take Trulance.  She is following healthy diet and drinking plenty of water.  Her hemoglobin A1c is now normal and her lipid panel has significantly improved.  Congratulated her on following healthy diet.  Patient is no longer experiencing rectal bleeding.  NSAIDs: None   Antiplts/Anticoagulants/Anti thrombotics: None   GI Procedures:  Colonoscopy 08/17/2021 by Dr. Vicente Males for screening - One 10 mm polyp in the ascending colon, removed with a cold snare. Resected and retrieved. - One 7 mm polyp in the descending colon, removed with a cold snare. Resected and retrieved. -  The examination was otherwise normal on direct and retroflexion views. DIAGNOSIS:  A. COLON POLYP, ASCENDING; COLD SNARE:  - SESSILE SERRATED POLYP.  - NEGATIVE FOR DYSPLASIA AND MALIGNANCY.   B. COLON POLYP, DESCENDING; COLD SNARE:  - HYPERPLASTIC POLYP.  - NEGATIVE FOR DYSPLASIA AND MALIGNANCY.   History reviewed. No pertinent past medical history.  Past Surgical History:  Procedure Laterality Date   BREAST BIOPSY Right 10/04/2021   u/s bx, 9:00 "heart" clip-path pending   COLONOSCOPY N/A 08/17/2021   Procedure: COLONOSCOPY;  Surgeon: Jonathon Bellows, MD;  Location: [facility] ENDOSCOPY;  Service: Gastroenterology;  Laterality: N/A;     Current Outpatient Medications:    diclofenac (VOLTAREN) 75 MG EC tablet, Take 1 tablet (75 mg total) by mouth 2 (two) times daily as needed., Disp: 60 tablet, Rfl: 0   DULoxetine (CYMBALTA) 30 MG capsule, Take 1 capsule (30 mg total) by mouth every evening for 14 days, THEN 2 capsules (60 mg total) every evening., Disp: 74 capsule, Rfl: 0   gabapentin (NEURONTIN) 300 MG capsule, Take 1 capsule (300 mg total) by mouth 2 (two) times daily., Disp: 60 capsule, Rfl: 0   hydrocortisone (ANUSOL-HC) 2.5 % rectal cream, Place 1 Application rectally 2 (two) times daily., Disp: 30 g, Rfl: 2   methocarbamol (ROBAXIN) 750 MG tablet, Take 1 tablet (750 mg total) by mouth at bedtime as needed for muscle spasms., Disp: 30 tablet, Rfl: 0   Plecanatide (TRULANCE) 3 MG TABS, Take by mouth., Disp: , Rfl:    Semaglutide-Weight Management (WEGOVY) 0.25 MG/0.5ML SOAJ, Inject  0.25 mg into the skin once a week., Disp: 2 mL, Rfl: 0   Family History  Problem Relation Age of Onset   Cancer Mother        gall bladder with mets   Hypertension Father    Esophageal cancer Sister    Diabetes Maternal Grandmother    Heart disease Maternal Grandmother    Stroke Maternal Grandmother    Cancer Maternal Grandfather    Breast cancer Neg Hx      Social History   Tobacco Use   Smoking  status: Former    Packs/day: 0.50    Years: 36.00    Total pack years: 18.00    Types: Cigarettes    Quit date: 09/15/2021    Years since quitting: 1.4   Smokeless tobacco: Never  Vaping Use   Vaping Use: Every day   Start date: 09/15/2021   Substances: Nicotine, Flavoring  Substance Use Topics   Alcohol use: Yes    Comment: occasional   Drug use: Never    Allergies as of 02/12/2023   (No Known Allergies)    Review of Systems:    All systems reviewed and negative except where noted in HPI.   Physical Exam:  BP 129/85 (BP Location: Left Arm, Patient Position: Sitting, Cuff Size: Normal)   Pulse 80   Temp 98.8 F (37.1 C) (Oral)   Ht '5\' 6"'$  (1.676 m)   Wt 224 lb 2 oz (101.7 kg)   BMI 36.17 kg/m  No LMP recorded. Patient is premenopausal.  General:   Alert,  Well-developed, well-nourished, pleasant and cooperative in NAD Head:  Normocephalic and atraumatic. Eyes:  Sclera clear, no icterus.   Conjunctiva pink. Ears:  Normal auditory acuity. Nose:  No deformity, discharge, or lesions. Mouth:  No deformity or lesions,oropharynx pink & moist. Neck:  Supple; no masses or thyromegaly. Lungs:  Respirations even and unlabored.  Clear throughout to auscultation.   No wheezes, crackles, or rhonchi. No acute distress. Heart:  Regular rate and rhythm; no murmurs, clicks, rubs, or gallops. Abdomen:  Normal bowel sounds. Soft, non-tender and non-distended without masses, hepatosplenomegaly or hernias noted.  No guarding or rebound tenderness.   Rectal: Nontender digital rectal exam, normal perianal exam Msk:  Symmetrical without gross deformities. Good, equal movement & strength bilaterally. Pulses:  Normal pulses noted. Extremities:  No clubbing or edema.  No cyanosis. Neurologic:  Alert and oriented x3;  grossly normal neurologically. Skin:  Intact without significant lesions or rashes. No jaundice. Psych:  Alert and cooperative. Normal mood and affect.  Imaging Studies: No  abdominal imaging  Assessment and Plan:   Jessica Robles is a 52 y.o. female with history of prediabetes, chronic constipation currently maintained on Trulance is seen in for follow-up of painless bright red blood per rectum, likely secondary to hemorrhoids.  Bleeding has temporarily subsided with Anusol cream.  Discussed with patient regarding long-term management of hemorrhoidal bleeding including outpatient hemorrhoid ligation or hemorrhoidectomy versus continuing topical hemorrhoidal creams as needed Patient would like to undergo hemorrhoid banding S/p ligation of right anterior and right posterior hemorrhoids Discussed the procedure, risks and benefits Proceed with hemorrhoid ligation today  Chronic constipation: Fairly regulated Continue Trulance 3 mg daily Continue high-fiber diet and adequate intake of water   Follow up as needed   Cephas Darby, MD

## 2023-02-12 NOTE — Progress Notes (Signed)
PROCEDURE NOTE: The patient presents with symptomatic grade 1 hemorrhoids, unresponsive to maximal medical therapy, requesting rubber band ligation of his/her hemorrhoidal disease.  All risks, benefits and alternative forms of therapy were described and informed consent was obtained.  The decision was made to band the LL internal hemorrhoid, and the [place] was used to perform band ligation without complication.  Digital anorectal examination was then performed to assure proper positioning of the band, and to adjust the banded tissue as required.  The patient was discharged home without pain or other issues.  Dietary and behavioral recommendations were given and (if necessary - prescriptions were given), along with follow-up instructions.  The patient will return as needed for follow-up and possible additional banding as required.  No complications were encountered and the patient tolerated the procedure well.

## 2023-02-14 ENCOUNTER — Ambulatory Visit: Payer: BC Managed Care – PPO | Admitting: Family Medicine

## 2023-02-14 ENCOUNTER — Encounter: Payer: Self-pay | Admitting: Family Medicine

## 2023-02-14 ENCOUNTER — Inpatient Hospital Stay (INDEPENDENT_AMBULATORY_CARE_PROVIDER_SITE_OTHER): Payer: BC Managed Care – PPO | Admitting: Radiology

## 2023-02-14 VITALS — BP 118/78 | HR 82 | Ht 66.0 in | Wt 227.0 lb

## 2023-02-14 DIAGNOSIS — M25552 Pain in left hip: Secondary | ICD-10-CM

## 2023-02-14 DIAGNOSIS — M5442 Lumbago with sciatica, left side: Secondary | ICD-10-CM | POA: Diagnosis not present

## 2023-02-14 DIAGNOSIS — G8929 Other chronic pain: Secondary | ICD-10-CM

## 2023-02-14 MED ORDER — TRIAMCINOLONE ACETONIDE 40 MG/ML IJ SUSP
80.0000 mg | Freq: Once | INTRAMUSCULAR | Status: AC
  Administered 2023-02-14: 80 mg via INTRAMUSCULAR

## 2023-02-14 NOTE — Addendum Note (Signed)
Addended by: Zada Girt on: 02/14/2023 04:57 PM   Modules accepted: Orders

## 2023-02-14 NOTE — Patient Instructions (Signed)
You have just been given a cortisone injection to reduce pain and inflammation. After the injection you may notice immediate relief of pain as a result of the Lidocaine. It is important to rest the area of the injection for 24 to 48 hours after the injection. There is a possibility of some temporary increased discomfort and swelling for up to 72 hours until the cortisone begins to work. If you do have pain, simply rest the joint and use ice. If you can tolerate over the counter medications, you can try Tylenol for added relief per package instructions. - Start duloxetine (contact us for refills) - Perform home exercises - Return in 2 months

## 2023-02-14 NOTE — Assessment & Plan Note (Signed)
Chronic condition, persistent symptoms despite current regimen, +FADIR that recreates symptomatology. She did elect to proceed with intra-articular left hip joint injection. Post-care reviewed, will start home exercises and follow-up in 2 months.

## 2023-02-14 NOTE — Progress Notes (Signed)
     Primary Care / Sports Medicine Office Visit  Patient Information:  Patient ID: Jessica Robles, female DOB: Apr 01, 1971 Age: 52 y.o. MRN: [id number]   Jessica Robles is a pleasant 52 y.o. female presenting with the following:  Chief Complaint  Patient presents with   Hip Pain    Left hip, 8 pain scale, sharp, aching with movement, constant, towards groin area, has had a injection before       Vitals:   02/14/23 1325  BP: 118/78  Pulse: 82  SpO2: 97%   Vitals:   02/14/23 1325  Weight: 227 lb (103 kg)  Height: '5\' 6"'$  (1.676 m)   Body mass index is 36.64 kg/m.  No results found.   Independent interpretation of notes and tests performed by another provider:   None  Procedures performed:   Procedure:  Injection of left hip joint under ultrasound guidance. Ultrasound guidance utilized for in-plane approach, no effusion noted Samsung HS60 device utilized with permanent recording / reporting. Verbal informed consent obtained and verified. Skin prepped in a sterile fashion. Ethyl chloride for topical local analgesia.  Completed without difficulty and tolerated well. Medication: triamcinolone acetonide 40 mg/mL suspension for injection 1 mL total and 2 mL lidocaine 1% without epinephrine utilized for needle placement anesthetic Advised to contact for fevers/chills, erythema, induration, drainage, or persistent bleeding.  Procedure:  Injection of left greater trochanteric region under ultrasound guidance. Ultrasound guidance utilized for out-of-plane  Samsung HS60 device utilized with permanent recording / reporting. Verbal informed consent obtained and verified. Skin prepped in a sterile fashion. Ethyl chloride for topical local analgesia.  Completed without difficulty and tolerated well. Medication: triamcinolone acetonide 40 mg/mL suspension for injection 1 mL total and 2 mL lidocaine 1% without epinephrine utilized for needle placement anesthetic Advised to contact for  fevers/chills, erythema, induration, drainage, or persistent bleeding.   Pertinent History, Exam, Impression, and Recommendations:   Jessica Robles was seen today for hip pain.  Arthralgia of left hip Assessment & Plan: Chronic condition, persistent symptoms despite current regimen, +FADIR that recreates symptomatology. She did elect to proceed with intra-articular left hip joint injection. Post-care reviewed, will start home exercises and follow-up in 2 months.  Orders: -     [place] LIMITED JOINT SPACE STRUCTURES LOW LEFT; Future  Greater trochanteric pain syndrome of left lower extremity Assessment & Plan: Persistent symptomatology despite recent treatments, tenderness at the greater trochanteric region for which she did elect to proceed with ultrasound-guided corticosteroid injection. She will start home exercises and return in 2 months.  Orders: -     [place] LIMITED JOINT SPACE STRUCTURES LOW LEFT; Future  Chronic bilateral low back pain with left-sided sciatica Assessment & Plan: Did not get a chance to start duloxetine due to some questions regarding the same. Reviewed nature of medication, expectations, timeline for response, and adverse effect profile. She will start this soon and we will coordinate a follow-up in 2 months.       Orders & Medications No orders of the defined types were placed in this encounter.  Orders Placed This Encounter  Procedures   [place] LIMITED JOINT SPACE STRUCTURES LOW LEFT     Return in about 2 months (around 04/16/2023).     Montel Culver, MD, [facility]   Primary Care Sports Medicine Primary Care and Sports Medicine at [facility]

## 2023-02-14 NOTE — Assessment & Plan Note (Signed)
Did not get a chance to start duloxetine due to some questions regarding the same. Reviewed nature of medication, expectations, timeline for response, and adverse effect profile. She will start this soon and we will coordinate a follow-up in 2 months.

## 2023-02-14 NOTE — Assessment & Plan Note (Signed)
Persistent symptomatology despite recent treatments, tenderness at the greater trochanteric region for which she did elect to proceed with ultrasound-guided corticosteroid injection. She will start home exercises and return in 2 months.

## 2023-03-09 NOTE — Progress Notes (Unsigned)
Patient: Jessica Robles  Service Category: E/M  Provider: Gaspar Cola, MD  DOB: Dec 24, 1970  DOS: 03/10/2023  Referring Provider: Montel Culver, MD  MRN: [id number]  Setting: Ambulatory outpatient  PCP: Glean Hess, MD  Type: New Patient  Specialty: Interventional Pain Management    Location: Office  Delivery: Face-to-face     Primary Reason(s) for Visit: Encounter for initial evaluation of one or more chronic problems (new to examiner) potentially causing chronic pain, and posing a threat to normal musculoskeletal function. (Level of risk: High) CC: Back Pain (Lumbar left ) and Hip Pain (Left )  HPI  Jessica Robles is a 52 y.o. year old, female patient, who comes for the first time to our practice referred by Zigmund Daniel Earley Abide, MD for our initial evaluation of her chronic pain. 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 Today she comes in for evaluation of her Back Pain (Lumbar left ) and Hip Pain (Left )  Pain Assessment: Location: Lower, Left Back (left hip) Radiating: back pain going down leg into the foot. Onset: More than a month ago Duration: Chronic pain Quality: Discomfort, Sharp, Shooting, Aching, Constant Severity: 3 /10 (subjective, self-reported pain score)  Effect on ADL: certain  movements cause a pain that stops her. Timing: Constant Modifying factors: nothing currently BP: 115/63  HR: 80  Onset and Duration: Gradual and  Present longer than 3 months Cause of pain: Unknown Severity: Getting worse, NAS-11 at its worse: 10/10, NAS-11 at its best: 3/10, NAS-11 now: 3/10, and NAS-11 on the average: 10/10 Timing: Morning, Afternoon, Night, and During activity or exercise Aggravating Factors: Bending, Kneeling, Lifiting, Motion, Prolonged sitting, Prolonged standing, Twisting, and Walking Alleviating Factors: Cold packs and Resting Associated Problems: Numbness, Tingling, Pain that wakes patient up, and Pain that does not allow patient to sleep Quality of Pain: Aching, Pressure-like, Sharp, Shooting, Stabbing, Throbbing, and Tingling    Jessica Robles is being evaluated for possible interventional pain management therapies for the treatment of her chronic pain.   According to the patient the primary area of pain is that of the lower back (Bilateral) (L>R).  She denies any prior back surgeries, physical therapy, or nerve blocks.  She does have some x-rays, but not on flexion and extension.  She also indicates the pain to be referred down the left lower extremity and hip.  The patient's secondary area pain is that of the lower extremity (Left).  She describes the pain to go all the way down to the bottom of the foot and what appears to be an S1 dermatomal distribution.  She also indicates that occasionally will go to the top of the foot and she has numbness on her big toe (L5).  However she denies any lower extremity weakness.  Occasionally she refers that it feels heavy.  The pain starts in the lower back and runs all the way down into her foot following what appears to be a radicular distribution.  The patient's third area pain is that of the hip (Left).  She denies any  prior surgeries or physical therapy.  She does indicate having had some x-rays and she also indicates having had some injections done by Dr. Zigmund Daniel (x 3) which do seem to help.  She denies any nerve conduction test of the lower extremities.  Physical exam:  The patient was able to toe walk and heel walk without any problems.  Lumbar flexion was within normal limits with the patient experiencing some leg discomfort through the back of the leg on flexion.  This discomfort/pain went all the way to the ankle but not into the foot.  Lumbar spine lateral bending was completely negative for any signs of foraminal stenosis however she felt a pulling sensation when she bent towards the right side and this pulling sensation was on the left side (contralateral).  Lumbar spine hyperextension on rotation maneuver triggered contralateral low back pain primarily when rotating towards the right side she experienced pain in the left lower back.  Provocative Patrick maneuver was completely negative on the right side and on the left side it seemed to trigger some pain on the left hip, with somewhat decreased range of motion.  Left sacroiliac joint seem to be +/-.  Initial impression on the physical exam is that this has a significant component that it is myofascial.  Plan: I will order some x-rays of the lumbar spine on flexion and extension, but I believe that the patient would greatly benefit from 6 weeks of physical therapy.  Unfortunately, the patient indicates that her insurance company does not want to cover physical therapy.  [name] has been informed that this initial visit was an evaluation only.  On the follow up appointment I will go over the results, including ordered tests and available interventional therapies. At that time she will have the opportunity to decide whether to proceed with offered therapies or not. In the event that Ms. Bentson prefers avoiding interventional options, this will conclude our involvement in the case.  Medication management recommendations may be provided upon request.  Historic Controlled Substance Pharmacotherapy Review  PMP and historical list of controlled substances: None Most recently prescribed opioid analgesics:   None MME/day:  0 mg/day  Historical Monitoring: The patient  reports no history of drug use. List of prior UDS Testing: No results found for: "MDMA", "COCAINSCRNUR", "PCPSCRNUR", "PCPQUANT", "CANNABQUANT", "THCU", "ETH", "CBDTHCR", "D8THCCBX", "D9THCCBX" Historical Background Evaluation: [place] PMP: PDMP reviewed during this encounter. Review of the past 12-months conducted.             PMP NARX Score Report:  Narcotic: 000 Sedative: 000 Stimulant: 000 [place] Department of public safety, offender search: Editor, commissioning Information) Non-contributory Risk Assessment Profile: Aberrant behavior: None observed or detected today Risk factors for fatal opioid overdose: None identified today PMP NARX Overdose Risk Score: 000 Fatal overdose hazard ratio (HR): Calculation deferred Non-fatal overdose hazard ratio (HR): Calculation deferred Risk of opioid abuse or dependence: 0.7-3.0% with doses ? 36 MME/day and 6.1-26% with doses ? 120 MME/day. Substance use disorder (SUD) risk level: See below Personal History of Substance Abuse (SUD-Substance use disorder):  Alcohol: Negative  Illegal Drugs: Negative  Rx Drugs: Negative  ORT Risk Level calculation: Low Risk  Opioid Risk Tool - 03/10/23 1242       Family History of Substance Abuse   Alcohol Negative    Illegal Drugs Negative    Rx Drugs Negative      Personal History of Substance Abuse   Alcohol Negative    Illegal Drugs Negative    Rx  Drugs Negative      Psychological Disease   Psychological Disease Negative    Depression Negative      Total Score   Opioid Risk Tool Scoring 0    Opioid Risk Interpretation Low Risk            ORT Scoring interpretation table:  Score <3 = Low Risk for SUD  Score between 4-7 = Moderate Risk for SUD  Score >8 = High Risk for Opioid Abuse   PHQ-2 Depression Scale:  Total score:    PHQ-2 Scoring interpretation table: (Score and probability of major depressive disorder)  Score 0 = No depression  Score 1 = 15.4%  Probability  Score 2 = 21.1% Probability  Score 3 = 38.4% Probability  Score 4 = 45.5% Probability  Score 5 = 56.4% Probability  Score 6 = 78.6% Probability   PHQ-9 Depression Scale:  Total score:    PHQ-9 Scoring interpretation table:  Score 0-4 = No depression  Score 5-9 = Mild depression  Score 10-14 = Moderate depression  Score 15-19 = Moderately severe depression  Score 20-27 = Severe depression (2.4 times higher risk of SUD and 2.89 times higher risk of overuse)   Pharmacologic Plan: As per protocol, I have not taken over any controlled substance management, pending the results of ordered tests and/or consults.            Initial impression: Pending review of available data and ordered tests.  Meds   Current Outpatient Medications:    diclofenac (VOLTAREN) 75 MG EC tablet, Take 1 tablet (75 mg total) by mouth 2 (two) times daily as needed., Disp: 60 tablet, Rfl: 0   gabapentin (NEURONTIN) 300 MG capsule, Take 1 capsule (300 mg total) by mouth 2 (two) times daily., Disp: 60 capsule, Rfl: 0   methocarbamol (ROBAXIN) 750 MG tablet, Take 1 tablet (750 mg total) by mouth at bedtime as needed for muscle spasms., Disp: 30 tablet, Rfl: 0   Plecanatide (TRULANCE) 3 MG TABS, Take by mouth., Disp: , Rfl:   Imaging Review  Cervical Imaging: Cervical DG complete: Results for orders placed during the hospital encounter of 03/07/21 DG Cervical Spine Complete  Narrative CLINICAL DATA:  Neck pain with radiation to right arm.  Numbness.  EXAM: CERVICAL SPINE - COMPLETE 4+ VIEW  COMPARISON:  None.  FINDINGS: The lateral view images through the bottom of C7. Prevertebral soft tissues are within normal limits. Maintenance of vertebral body height. Straightening of expected cervical lordosis. Loss of intervertebral disc height at C5-6 with endplate osteophyte formation.  Facets are well-aligned. Right-sided neural foraminal narrowing at C4-5 and C5-6 secondary to facet arthropathy and  uncovertebral joint hypertrophy.  On the left, C5-6 mild neural foraminal narrowing secondary to uncovertebral joint hypertrophy. Cervicothoracic junction within normal limits on swimmer's view. Lateral masses and odontoid process partially obscured on open-mouth view, without gross abnormality.  IMPRESSION: Cervical spondylosis at C5-6 with areas of right greater than left neural foraminal narrowing, as detailed above.   Electronically Signed By: Abigail Miyamoto M.D. On: 03/09/2021 13:10  Lumbosacral Imaging: Lumbar DG (Complete) 4+V: Results for orders placed during the hospital encounter of 08/22/22 DG Lumbar Spine Complete  Narrative CLINICAL DATA:  Chronic low back pain.  EXAM: LUMBAR SPINE - COMPLETE 4+ VIEW  COMPARISON:  None Available.  FINDINGS: Normal alignment noted.  There is no evidence of acute fracture or subluxation.  There is no evidence of spondylolisthesis or focal bony lesion.  Facet arthropathy in the  LOWER lumbar spine identified.  IMPRESSION: 1. No acute abnormality. 2. 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   Electronically Signed By: Margarette Canada M.D. On: 08/24/2022 13:00  Hip Imaging: Hip-L DG 2-3 views: Results for orders placed during the hospital encounter of 08/22/22 DG Hip Unilat W OR W/O Pelvis 2-3 Views Left  Narrative CLINICAL DATA:  Left hip pain.  EXAM: DG HIP (WITH OR WITHOUT PELVIS) 2-3V LEFT  COMPARISON:  None Available.  FINDINGS: No acute fracture or dislocation. Nonspecific curvilinear soft tissue calcification adjacent the left greater trochanter. Remainder the exam is unremarkable.  IMPRESSION: No acute findings.   Electronically Signed By: Marin Olp M.D. On: 08/24/2022 13:04  Knee Imaging: Knee-L DG 4 views: Results for orders placed during the hospital encounter of 08/22/22 DG Knee Complete 4 Views Left  Narrative CLINICAL DATA:  LEFT knee pain.  EXAM: LEFT KNEE - COMPLETE 4+  VIEW  COMPARISON:  None Available.  FINDINGS: No evidence of fracture, dislocation, or joint effusion. No evidence of arthropathy or other focal bone abnormality. Soft tissues are unremarkable.  IMPRESSION: Negative.   Electronically Signed By: Margarette Canada M.D. On: 08/24/2022 13:05  Ankle Imaging: Ankle-L DG Complete: Results for orders placed in visit on 08/29/22 DG Ankle Complete Left  Narrative CLINICAL DATA:  Pain and swelling left foot and ankle  EXAM: LEFT ANKLE COMPLETE - 3+ VIEW  COMPARISON:  08/29/2022  FINDINGS: There is no evidence of fracture, dislocation, or joint effusion. There is no evidence of arthropathy or other focal bone abnormality. Soft tissues are unremarkable.  IMPRESSION: Negative.   Electronically Signed By: Jerilynn Mages.  Shick M.D. On: 08/31/2022 11:11  Foot Imaging: Foot-R DG Complete: Results for orders placed in visit on 05/06/17 DG Foot Complete Right  Narrative Please see detailed radiograph report in office note.  Foot-L DG Complete: Results for orders placed in visit on 08/29/22 DG Foot Complete Left  Narrative CLINICAL DATA:  Pain and swelling  EXAM: LEFT FOOT - COMPLETE 3+ VIEW  COMPARISON:  08/29/2022  FINDINGS: There is no evidence of fracture or dislocation. There is no evidence of arthropathy or other focal bone abnormality. Soft tissues are unremarkable.  IMPRESSION: Negative.   Electronically Signed By: Jerilynn Mages.  Shick M.D. On: 08/31/2022 11:12  Complexity Note: Imaging results reviewed.                         ROS  Cardiovascular: No reported cardiovascular signs or symptoms such as High blood pressure, coronary artery disease, abnormal heart rate or rhythm, heart attack, blood thinner therapy or heart weakness and/or failure Pulmonary or Respiratory: No reported pulmonary signs or symptoms such as wheezing and difficulty taking a deep full breath (Asthma), difficulty blowing air out (Emphysema), coughing up  mucus (Bronchitis), persistent dry cough, or temporary stoppage of breathing during sleep Neurological: No reported neurological signs or symptoms such as seizures, abnormal skin sensations, urinary and/or fecal incontinence, being born with an abnormal open spine and/or a tethered spinal cord Psychological-Psychiatric: No reported psychological or psychiatric signs or symptoms such as difficulty sleeping, anxiety, depression, delusions or hallucinations (schizophrenial), mood swings (bipolar disorders) or suicidal ideations or attempts Gastrointestinal: No reported gastrointestinal signs or symptoms such as vomiting or evacuating blood, reflux, heartburn, alternating episodes of diarrhea and constipation, inflamed or scarred liver, or pancreas or irrregular and/or infrequent bowel movements Genitourinary: No reported renal or genitourinary signs or symptoms such as difficulty voiding or producing urine, peeing blood,  non-functioning kidney, kidney stones, difficulty emptying the bladder, difficulty controlling the flow of urine, or chronic kidney disease Hematological: No reported hematological signs or symptoms such as prolonged bleeding, low or poor functioning platelets, bruising or bleeding easily, hereditary bleeding problems, low energy levels due to low hemoglobin or being anemic Endocrine: No reported endocrine signs or symptoms such as high or low blood sugar, rapid heart rate due to high thyroid levels, obesity or weight gain due to slow thyroid or thyroid disease Rheumatologic: No reported rheumatological signs and symptoms such as fatigue, joint pain, tenderness, swelling, redness, heat, stiffness, decreased range of motion, with or without associated rash Musculoskeletal: Negative for myasthenia gravis, muscular dystrophy, multiple sclerosis or malignant hyperthermia Work History: Working full time  Allergies  Ms. Verdell has No Known Allergies.  Laboratory Chemistry Profile   Renal Lab  Results  Component Value Date   BUN 28 (H) 08/06/2022   CREATININE 0.68 08/06/2022   BCR 41 (H) 08/06/2022     Electrolytes Lab Results  Component Value Date   NA 143 08/06/2022   K 5.0 08/06/2022   CL 103 08/06/2022   CALCIUM 9.6 08/06/2022     Hepatic Lab Results  Component Value Date   AST 35 08/06/2022   ALT 47 (H) 08/06/2022   ALBUMIN 4.4 08/06/2022   ALKPHOS 96 08/06/2022     ID Lab Results  Component Value Date   PREGTESTUR NEGATIVE 08/17/2021     Bone No results found for: "VD25OH", "VD125OH2TOT", "[id number]", "[id number]", "25OHVITD1", "25OHVITD2", "25OHVITD3", "TESTOFREE", "TESTOSTERONE"   Endocrine Lab Results  Component Value Date   GLUCOSE 113 (H) 08/06/2022   HGBA1C 5.4 02/10/2023   TSH 1.710 07/18/2021     Neuropathy Lab Results  Component Value Date   HGBA1C 5.4 02/10/2023     CNS No results found for: "COLORCSF", "APPEARCSF", "RBCCOUNTCSF", "WBCCSF", "POLYSCSF", "LYMPHSCSF", "EOSCSF", "PROTEINCSF", "GLUCCSF", "JCVIRUS", "CSFOLI", "IGGCSF", "LABACHR", "ACETBL"   Inflammation (CRP: Acute  ESR: Chronic) Lab Results  Component Value Date   ESRSEDRATE 3 08/06/2022     Rheumatology Lab Results  Component Value Date   RF 13.9 08/06/2022     Coagulation Lab Results  Component Value Date   PLT 301 08/06/2022     Cardiovascular Lab Results  Component Value Date   HGB 14.2 08/06/2022   HCT 42.3 08/06/2022     Screening Lab Results  Component Value Date   PREGTESTUR NEGATIVE 08/17/2021     Cancer No results found for: "CEA", "CA125", "LABCA2"   Allergens No results found for: "ALMOND", "APPLE", "ASPARAGUS", "AVOCADO", "BANANA", "BARLEY", "BASIL", "BAYLEAF", "GREENBEAN", "LIMABEAN", "WHITEBEAN", "BEEFIGE", "REDBEET", "BLUEBERRY", "BROCCOLI", "CABBAGE", "MELON", "CARROT", "CASEIN", "CASHEWNUT", "CAULIFLOWER", "CELERY"     Note: Lab results reviewed.  [place]  Drug: Ms. Tollis  reports no history of drug use. Alcohol:  reports  current alcohol use. Tobacco:  reports that she quit smoking about 17 months ago. Her smoking use included cigarettes. She has a 18.00 pack-year smoking history. She has never used smokeless tobacco. Medical:  has no past medical history on file. Family: family history includes Cancer in her maternal grandfather and mother; Diabetes in her maternal grandmother; Esophageal cancer in her sister; Heart disease in her maternal grandmother; Hypertension in her father; Stroke in her maternal grandmother.  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) 08/22/2022   Arthralgia of knee (Left) 08/22/2022   Peroneal tendinitis of left lower leg 08/22/2022   Chronic idiopathic constipation 09/30/2022   Arthralgia of hip (Left) 10/03/2022   Hyperlipidemia, mixed 02/10/2023   BMI 36.0-36.9,adult 02/10/2023   Chronic pain syndrome 03/10/2023   Pharmacologic therapy 03/10/2023   Disorder of skeletal system 03/10/2023   Problems influencing health status 03/10/2023   Chronic lower extremity pain (2ry area of Pain) (Left) 03/10/2023   Chronic hip pain (3ry area of Pain) (Left) 03/10/2023   Resolved Ambulatory Problems    Diagnosis Date Noted   Right elbow pain 03/07/2021   No Additional Past Medical History   Constitutional Exam  General appearance: Well nourished, well developed, and well hydrated. In no apparent acute distress Vitals:   03/10/23 1239  BP: 115/63  Pulse: 80  Resp: 16  Temp: 98.6  F (37 C)  TempSrc: Temporal  SpO2: 100%  Weight: 214 lb (97.1 kg)  Height: 5\' 6"  (1.676 m)   BMI Assessment: Estimated body mass index is 34.54 kg/m as calculated from the following:   Height as of this encounter: 5\' 6"  (1.676 m).   Weight as of this encounter: 214 lb (97.1 kg).  BMI interpretation table: BMI level Category Range association with higher incidence of chronic pain  <18 kg/m2 Underweight   18.5-24.9 kg/m2 Ideal body weight   25-29.9 kg/m2 Overweight Increased incidence by 20%  30-34.9 kg/m2 Obese (Class I) Increased incidence by 68%  35-39.9 kg/m2 Severe obesity (Class II) Increased incidence by 136%  >40 kg/m2 Extreme obesity (Class III) Increased incidence by 254%   Patient's current BMI Ideal Body weight  Body mass index is 34.54 kg/m. Ideal body weight: 59.3 kg (130 lb 11.7 oz) Adjusted ideal body weight: 74.4 kg (164 lb 0.6 oz)   BMI Readings from Last 4 Encounters:  03/10/23 34.54 kg/m  02/14/23 36.64 kg/m  02/12/23 36.17 kg/m  02/10/23 36.32 kg/m   Wt Readings from Last 4 Encounters:  03/10/23 214 lb (97.1 kg)  02/14/23 227 lb (103 kg)  02/12/23 224 lb 2 oz (101.7 kg)  02/10/23 225 lb (102.1 kg)    Psych/Mental status: Alert, oriented x 3 (person, place, & time)       Eyes: PERLA Respiratory: No evidence of acute respiratory distress  Assessment  Primary Diagnosis & Pertinent Problem List: The primary encounter diagnosis was Chronic pain syndrome. Diagnoses of Chronic bilateral low back pain with left-sided sciatica, Chronic lower extremity pain (2ry area of Pain) (Left), Chronic hip pain (3ry area of Pain) (Left), Greater trochanteric pain syndrome (Left), Pharmacologic therapy, Disorder of skeletal system, and Problems influencing health status were also pertinent to this visit.  Visit Diagnosis (New problems to examiner): 1. Chronic pain syndrome   2. Chronic bilateral low back pain with left-sided sciatica   3. Chronic lower extremity  pain (2ry area of Pain) (Left)   4. Chronic hip pain (3ry area of Pain) (Left)   5. Greater trochanteric pain syndrome (Left)   6. Pharmacologic therapy   7. Disorder of skeletal system   8. Problems influencing health status    Plan of Care (Initial workup plan)  Note: Ms. Drill was  reminded that as per protocol, today's visit has been an evaluation only. We have not taken over the patient's controlled substance management.  Problem-specific plan: No problem-specific Assessment & Plan notes found for this encounter.  Lab Orders         Compliance Drug Analysis, Ur         Comp. Metabolic Panel (12)         Magnesium         Vitamin B12         Sedimentation rate         25-Hydroxy vitamin D Lcms D2+D3         C-reactive protein     Imaging Orders         DG Lumbar Spine Complete W/Bend     Referral Orders         Ambulatory referral to Physical Therapy     Procedure Orders    No procedure(s) ordered today   Pharmacotherapy (current): Medications ordered:  No orders of the defined types were placed in this encounter.  Medications administered during this visit: Sola A. Mcgrory had no medications administered during this visit.   Analgesic Pharmacotherapy:  Opioid Analgesics: For patients currently taking or requesting to take opioid analgesics, in accordance with [place], we will assess their risks and indications for the use of these substances. After completing our evaluation, we may offer recommendations, but we no longer take patients for medication management. The prescribing physician will ultimately decide, based on his/her training and level of comfort whether to adopt any of the recommendations, including whether or not to prescribe such medicines.  Membrane stabilizer: To be determined at a later time  Muscle relaxant: To be determined at a later time  NSAID: To be determined at a later time  Other analgesic(s): To be determined at  a later time   Interventional management options: Ms. Sangha was informed that there is no guarantee that she would be a candidate for interventional therapies. The decision will be based on the results of diagnostic studies, as well as Ms. Garringer's risk profile.  Procedure(s) under consideration:  Pending results of ordered studies      Interventional Therapies  Risk Factors  Considerations:     Planned  Pending:   See above for possible orders   Under consideration:   Pending completion of evaluation   Completed:   None at this time   Completed by other providers:   Diagnostic/therapeutic left hip injection x2 (US guidance) (10/03/2022, 02/14/2023) by Montel Culver, MD ([facility])    Therapeutic  Palliative (PRN) options:   None established      Provider-requested follow-up: Return in about 2 weeks (around 03/24/2023) for (19min), Eval-day (M,W), (F2F), 2nd Visit, for review of ordered tests.  Future Appointments  Date Time Provider [place]  04/07/2023  1:00 PM Milinda Pointer, MD [facility] None  04/18/2023  1:40 PM Montel Culver, MD [facility]  09/11/2023  9:40 AM Glean Hess, MD [facility]    Duration of encounter: 52 minutes.  Total time on encounter, as per AMA guidelines included both the face-to-face and non-face-to-face time personally spent by the physician and/or other qualified health care professional(s) on the day of the encounter (includes time in activities that require the physician or other qualified health care professional and does not include time in activities normally performed by clinical staff). Physician's time may include the following activities when performed: Preparing  to see the patient (e.g., pre-charting review of records, searching for previously ordered imaging, lab work, and nerve conduction tests) Review of prior analgesic pharmacotherapies. Reviewing PMP Interpreting ordered tests (e.g., lab work, imaging,  nerve conduction tests) Performing post-procedure evaluations, including interpretation of diagnostic procedures Obtaining and/or reviewing separately obtained history Performing a medically appropriate examination and/or evaluation Counseling and educating the patient/family/caregiver Ordering medications, tests, or procedures Referring and communicating with other health care professionals (when not separately reported) Documenting clinical information in the electronic or other health record Independently interpreting results (not separately reported) and communicating results to the patient/ family/caregiver Care coordination (not separately reported)  Note by: Gaspar Cola, MD (TTS technology used. I apologize for any typographical errors that were not detected and corrected.) Date: 03/10/2023; Time: 2:19 PM

## 2023-03-10 ENCOUNTER — Encounter: Payer: Self-pay | Admitting: Pain Medicine

## 2023-03-10 ENCOUNTER — Ambulatory Visit (HOSPITAL_BASED_OUTPATIENT_CLINIC_OR_DEPARTMENT_OTHER): Payer: BC Managed Care – PPO | Admitting: Pain Medicine

## 2023-03-10 ENCOUNTER — Ambulatory Visit
Admission: RE | Admit: 2023-03-10 | Discharge: 2023-03-10 | Disposition: A | Discharge status: Home / Self Care | Payer: BC Managed Care – PPO | Attending: Pain Medicine | Admitting: Pain Medicine

## 2023-03-10 ENCOUNTER — Ambulatory Visit
Admission: RE | Admit: 2023-03-10 | Discharge: 2023-03-10 | Disposition: A | Discharge status: Home / Self Care | Payer: BC Managed Care – PPO | Source: Ambulatory Visit | Attending: Pain Medicine | Admitting: Pain Medicine

## 2023-03-10 VITALS — BP 115/63 | HR 80 | Temp 98.6°F | Resp 16 | Ht 66.0 in | Wt 214.0 lb

## 2023-03-10 DIAGNOSIS — M25552 Pain in left hip: Secondary | ICD-10-CM | POA: Insufficient documentation

## 2023-03-10 DIAGNOSIS — M79605 Pain in left leg: Secondary | ICD-10-CM | POA: Insufficient documentation

## 2023-03-10 DIAGNOSIS — M5442 Lumbago with sciatica, left side: Secondary | ICD-10-CM | POA: Insufficient documentation

## 2023-03-10 DIAGNOSIS — Z79899 Other long term (current) drug therapy: Secondary | ICD-10-CM | POA: Diagnosis not present

## 2023-03-10 DIAGNOSIS — Z789 Other specified health status: Secondary | ICD-10-CM | POA: Insufficient documentation

## 2023-03-10 DIAGNOSIS — G8929 Other chronic pain: Secondary | ICD-10-CM | POA: Insufficient documentation

## 2023-03-10 DIAGNOSIS — M899 Disorder of bone, unspecified: Secondary | ICD-10-CM | POA: Insufficient documentation

## 2023-03-10 DIAGNOSIS — G894 Chronic pain syndrome: Secondary | ICD-10-CM | POA: Insufficient documentation

## 2023-03-10 DIAGNOSIS — M545 Low back pain, unspecified: Secondary | ICD-10-CM | POA: Diagnosis not present

## 2023-03-10 NOTE — Patient Instructions (Signed)
____________________________________________________________________________________________  New Patients  Welcome to [facility] [place] [facility].   Initial Visit The first or initial visit consists of an evaluation only.   Interventional pain management.  We offer therapies other than opioid controlled substances to manage chronic pain. These include, but are not limited to, diagnostic, therapeutic, and palliative specialized injection therapies (i.e.: Epidural Steroids, Facet Blocks, etc.). We specialize in a variety of nerve blocks as well as radiofrequency treatments. We offer pain implant evaluations and trials, as well as follow up management. In addition we also provide a variety joint injections, including Viscosupplementation (AKA: Gel Therapy).  Prescription Pain Medication. We specialize in alternatives to opioids. We can provide evaluations and recommendations for/of pharmacologic therapies based on CDC Guidelines.  We no longer take patients for long-term medication management. We will not be taking over your pain medications.  ____________________________________________________________________________________________    ____________________________________________________________________________________________  Patient Information update  To: All of our patients.  Re: Name change.  It has been made official that our current name, "[facility]"   will soon be changed to "[facility]".   The purpose of this change is to eliminate any confusion created by the concept of our practice being a "Medication Management Pain Clinic". In the past this has led to the misconception that we treat pain primarily by the use of prescription medications.  Nothing can be farther from the truth.   Understanding PAIN MANAGEMENT: To  further understand what our practice does, you first have to understand that "Pain Management" is a subspecialty that requires additional training once a physician has completed their specialty training, which can be in either Anesthesia, Neurology, Psychiatry, or Physical Medicine and Rehabilitation (PMR). Each one of these contributes to the final approach taken by each physician to the management of their patient's pain. To be a "Pain Management Specialist" you must have first completed one of the specialty trainings below.  Anesthesiologists - trained in clinical pharmacology and interventional techniques such as nerve blockade and regional as well as central neuroanatomy. They are trained to block pain before, during, and after surgical interventions.  Neurologists - trained in the diagnosis and pharmacological treatment of complex neurological conditions, such as Multiple Sclerosis, Parkinson's, spinal cord injuries, and other systemic conditions that may be associated with symptoms that may include but are not limited to pain. They tend to rely primarily on the treatment of chronic pain using prescription medications.  Psychiatrist - trained in conditions affecting the psychosocial wellbeing of patients including but not limited to depression, anxiety, schizophrenia, personality disorders, addiction, and other substance use disorders that may be associated with chronic pain. They tend to rely primarily on the treatment of chronic pain using prescription medications.   Physical Medicine and Rehabilitation (PMR) physicians, also known as physiatrists - trained to treat a wide variety of medical conditions affecting the brain, spinal cord, nerves, bones, joints, ligaments, muscles, and tendons. Their training is primarily aimed at treating patients that have suffered injuries that have caused severe physical impairment. Their training is primarily aimed at the physical therapy and rehabilitation of those  patients. They may also work alongside orthopedic surgeons or neurosurgeons using their expertise in assisting surgical patients to recover after their surgeries.  INTERVENTIONAL PAIN MANAGEMENT is sub-subspecialty of Pain Management.  Our physicians are Board-certified in Anesthesia, Pain Management, and Interventional Pain Management.  This meaning that not only have they been trained   and Board-certified in their specialty of Anesthesia, and subspecialty of Pain Management, but they have also received further training in the sub-subspecialty of Interventional Pain Management, in order to become Board-certified as INTERVENTIONAL PAIN MANAGEMENT SPECIALIST.    Mission: Our goal is to use our skills in  INTERVENTIONAL PAIN MANAGEMENT as alternatives to the chronic use of prescription opioid medications for the treatment of pain. To make this more clear, we have changed our name to reflect what we do and offer. We will continue to offer medication management assessment and recommendations, but we will not be taking over any patient's medication management.  ____________________________________________________________________________________________     

## 2023-03-10 NOTE — Progress Notes (Signed)
Safety precautions to be maintained throughout the outpatient stay will include: orient to surroundings, keep bed in low position, maintain call bell within reach at all times, provide assistance with transfer out of bed and ambulation.  

## 2023-03-12 LAB — COMPLIANCE DRUG ANALYSIS, UR

## 2023-03-13 ENCOUNTER — Encounter: Payer: Self-pay | Admitting: Pain Medicine

## 2023-03-13 DIAGNOSIS — F1290 Cannabis use, unspecified, uncomplicated: Secondary | ICD-10-CM | POA: Insufficient documentation

## 2023-03-13 DIAGNOSIS — R892 Abnormal level of other drugs, medicaments and biological substances in specimens from other organs, systems and tissues: Secondary | ICD-10-CM | POA: Insufficient documentation

## 2023-03-17 LAB — COMP. METABOLIC PANEL (12)
AST: 24 IU/L (ref 0–40)
Albumin/Globulin Ratio: 1.8 (ref 1.2–2.2)
Albumin: 4.3 g/dL (ref 3.8–4.9)
Alkaline Phosphatase: 81 IU/L (ref 44–121)
BUN/Creatinine Ratio: 22 (ref 9–23)
BUN: 12 mg/dL (ref 6–24)
Bilirubin Total: 0.3 mg/dL (ref 0.0–1.2)
Calcium: 9.2 mg/dL (ref 8.7–10.2)
Chloride: 101 mmol/L (ref 96–106)
Creatinine, Ser: 0.55 mg/dL — ABNORMAL LOW (ref 0.57–1.00)
Globulin, Total: 2.4 g/dL (ref 1.5–4.5)
Glucose: 95 mg/dL (ref 70–99)
Potassium: 3.8 mmol/L (ref 3.5–5.2)
Sodium: 140 mmol/L (ref 134–144)
Total Protein: 6.7 g/dL (ref 6.0–8.5)
eGFR: 110 mL/min/1.73

## 2023-03-17 LAB — VITAMIN B12: Vitamin B-12: 700 pg/mL (ref 232–1245)

## 2023-03-17 LAB — MAGNESIUM: Magnesium: 1.9 mg/dL (ref 1.6–2.3)

## 2023-03-17 LAB — SEDIMENTATION RATE: Sed Rate: 14 mm/hr (ref 0–40)

## 2023-03-17 LAB — 25-HYDROXY VITAMIN D LCMS D2+D3
25-Hydroxy, Vitamin D-2: <1 ng/mL
25-Hydroxy, Vitamin D-3: 19 ng/mL
25-Hydroxy, Vitamin D: 19 ng/mL — ABNORMAL LOW

## 2023-03-17 LAB — C-REACTIVE PROTEIN: CRP: 3 mg/L (ref 0–10)

## 2023-03-23 NOTE — Progress Notes (Unsigned)
PROVIDER NOTE: Information contained herein reflects review and annotations entered in association with encounter. Interpretation of such information and data should be left to medically-trained personnel. Information provided to patient can be located elsewhere in the medical record under "Patient Instructions". Document created using STT-dictation technology, any transcriptional errors that may result from process are unintentional.    Patient: Jessica Robles  Service Category: E/M  Provider: Oswaldo Done, MD  DOB: [birth year]  DOS: 03/24/2023  Referring Provider: Reubin Milan, MD  MRN: [id number]  Specialty: Interventional Pain Management  PCP: Reubin Milan, MD  Type: Established Patient  Setting: Ambulatory outpatient    Location: Office  Delivery: Face-to-face     Primary Reason(s) for Visit: Encounter for evaluation before starting new chronic pain management plan of care (Level of risk: moderate) CC: No chief complaint on file.  HPI  Ms. Sabey is a 52 y.o. year old, female patient, who comes today for a follow-up evaluation to review the test results and decide on a treatment plan. 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 Her primarily concern today is the No chief complaint on file.  Pain Assessment: Location:     Radiating:   Onset:   Duration:   Quality:   Severity:  /10  (subjective, self-reported pain score)  Effect on ADL:   Timing:   Modifying factors:   BP:    HR:    Ms. Meadow comes in today for a follow-up visit after her initial evaluation on 03/10/2023. Today we went over the results of her tests. These were explained in "Layman's terms". During today's appointment we went over my diagnostic impression, as well as the proposed treatment plan.  ***  Patient presented with interventional treatment options. Ms. Koffler was informed that I will not be providing medication management. Pharmacotherapy evaluation including recommendations may be offered, if specifically requested.   Controlled Substance Pharmacotherapy Assessment REMS (Risk Evaluation and Mitigation Strategy)  Opioid Analgesic: None MME/day: 0 mg/day  Pill Count: None expected due to no prior prescriptions written by our practice. No notes on file Pharmacokinetics: Liberation and absorption (onset of action): WNL Distribution (time to peak effect): WNL Metabolism and excretion (duration of action): WNL         Pharmacodynamics: Desired effects: Analgesia: Ms. Dibenedetto reports >50% benefit. Functional ability: Patient reports that medication allows her to accomplish basic ADLs Clinically meaningful improvement in function (CMIF): Sustained CMIF goals met Perceived effectiveness: Described as relatively effective, allowing for increase in activities of daily living (ADL) Undesirable effects: Side-effects or Adverse reactions: None reported Monitoring: [place] PMP: PDMP reviewed during this encounter. Online review of the past 53-month period previously conducted. Not applicable at this point since we have not taken over the patient's medication management yet. List of other Serum/Urine Drug Screening Test(s):  No results found for: "AMPHSCRSER", "BARBSCRSER", "BENZOSCRSER", "COCAINSCRSER", "COCAINSCRNUR", "PCPSCRSER", "THCSCRSER", "THCU", "CANNABQUANT", "OPIATESCRSER", "OXYSCRSER",  "PROPOXSCRSER", "ETH", "CBDTHCR", "D8THCCBX", "D9THCCBX" List of all UDS test(s) done:  Lab Results  Component Value Date  SUMMARY Note 03/10/2023   Last UDS on record: Summary  Date Value Ref Range Status  03/10/2023 Note  Final    Comment:    ==================================================================== Compliance Drug Analysis, Ur ==================================================================== Test                             Result       Flag       Units  Drug Present and Declared for Prescription Verification   Gabapentin                     PRESENT      EXPECTED   Methocarbamol                  PRESENT      EXPECTED  Drug Present not Declared for Prescription Verification   Carboxy-THC                    265          UNEXPECTED ng/mg creat    Carboxy-THC is a metabolite of tetrahydrocannabinol (THC). Source of    THC is most commonly herbal marijuana or marijuana-based products,    but THC is also present in a scheduled prescription medication.    Trace amounts of THC can be present in hemp and cannabidiol (CBD)    products. This test is not intended to distinguish between delta-9-    tetrahydrocannabinol, the predominant form of THC in most herbal or    marijuana-based products, and delta-8-tetrahydrocannabinol.  Drug Absent but Declared for Prescription Verification   Diclofenac                     Not Detected UNEXPECTED    Diclofenac, as indicated in the declared medication list, is not    always detected even when used as directed.  ==================================================================== Test                      Result    Flag   Units      Ref Range   Creatinine              124              mg/dL      >=16>=20 ==================================================================== Declared Medications:  The flagging and interpretation on this report are based on the  following declared medications.  Unexpected results may arise from   inaccuracies in the declared medications.   **Note: The testing scope of this panel includes these medications:   Gabapentin (Neurontin)  Methocarbamol (Robaxin)   **Note: The testing scope of this panel does not include small to  moderate amounts of these reported medications:   Diclofenac (Voltaren)   **Note: The testing scope of this panel does not include the  following reported medications:   Plecanatide (Trulance) ==================================================================== For clinical consultation, please call [phone]. ====================================================================    UDS interpretation: No unexpected findings.          Medication Assessment Form: Not applicable. No opioids. Treatment compliance: Not applicable Risk Assessment Profile: Aberrant behavior: See initial evaluations. None observed or detected today Comorbid factors increasing risk of overdose: See initial evaluation. No additional risks detected today Opioid risk tool (ORT):     03/10/2023   12:42 PM  Opioid Risk   Alcohol 0  Illegal Drugs 0  Rx Drugs 0  Alcohol 0  Illegal Drugs 0  Rx Drugs 0  Psychological Disease 0  Depression 0  Opioid Risk Tool Scoring 0  Opioid Risk Interpretation Low Risk    ORT Scoring interpretation table:  Score <3 = Low Risk for SUD  Score between 4-7 = Moderate Risk for SUD  Score >8 = High Risk for Opioid Abuse   Risk of substance use disorder (SUD): Low  Risk Mitigation Strategies:  Patient opioid safety counseling: No controlled substances prescribed. Patient-Prescriber Agreement (PPA): No agreement signed.  Controlled substance notification to other providers: None required. No opioid therapy.  Pharmacologic Plan: Non-opioid analgesic therapy offered. Interventional alternatives discussed.             Laboratory Chemistry Profile   Renal Lab Results  Component Value Date   BUN 12 03/10/2023   CREATININE 0.55 (L)  03/10/2023   BCR 22 03/10/2023     Electrolytes Lab Results  Component Value Date   NA 140 03/10/2023   K 3.8 03/10/2023   CL 101 03/10/2023   CALCIUM 9.2 03/10/2023   MG 1.9 03/10/2023     Hepatic Lab Results  Component Value Date   AST 24 03/10/2023   ALT 47 (H) 08/06/2022   ALBUMIN 4.3 03/10/2023   ALKPHOS 81 03/10/2023     ID Lab Results  Component Value Date   PREGTESTUR NEGATIVE 08/17/2021     Bone Lab Results  Component Value Date   25OHVITD1 19 (L) 03/10/2023   25OHVITD2 <1.0 03/10/2023   25OHVITD3 19 03/10/2023     Endocrine Lab Results  Component Value Date   GLUCOSE 95 03/10/2023   HGBA1C 5.4 02/10/2023   TSH 1.710 07/18/2021     Neuropathy Lab Results  Component Value Date   VITAMINB12 700 03/10/2023   HGBA1C 5.4 02/10/2023     CNS No results found for: "COLORCSF", "APPEARCSF", "RBCCOUNTCSF", "WBCCSF", "POLYSCSF", "LYMPHSCSF", "EOSCSF", "PROTEINCSF", "GLUCCSF", "JCVIRUS", "CSFOLI", "IGGCSF", "LABACHR", "ACETBL"   Inflammation (CRP: Acute  ESR: Chronic) Lab Results  Component Value Date   CRP 3 03/10/2023   ESRSEDRATE 14 03/10/2023     Rheumatology Lab Results  Component Value Date   RF 13.9 08/06/2022     Coagulation Lab Results  Component Value Date   PLT 301 08/06/2022     Cardiovascular Lab Results  Component Value Date   HGB 14.2 08/06/2022   HCT 42.3 08/06/2022     Screening Lab Results  Component Value Date   PREGTESTUR NEGATIVE 08/17/2021     Cancer No results found for: "CEA", "CA125", "LABCA2"   Allergens No results found for: "ALMOND", "APPLE", "ASPARAGUS", "AVOCADO", "BANANA", "BARLEY", "BASIL", "BAYLEAF", "GREENBEAN", "LIMABEAN", "WHITEBEAN", "BEEFIGE", "REDBEET", "BLUEBERRY", "BROCCOLI", "CABBAGE", "MELON", "CARROT", "CASEIN", "CASHEWNUT", "CAULIFLOWER", "CELERY"     Note: Lab results reviewed.  Recent Diagnostic Imaging Review  Cervical Imaging: Cervical MR wo contrast: No results found for this or  any previous visit.  Cervical MR wo contrast: No valid procedures specified. Cervical CT wo contrast: No results found for this or any previous visit.  Cervical DG Bending/F/E views: No results found for this or any previous visit.   Shoulder Imaging: Shoulder-R MR wo contrast: No results found for this or any previous visit.  Shoulder-L MR wo contrast: No results found for this or any previous visit.  Shoulder-R DG: No results found for this or any previous visit.  Shoulder-L DG: No results found for this or any previous visit.   Thoracic Imaging: Thoracic MR wo contrast: No results found for this or any previous visit.  Thoracic MR wo contrast: No valid procedures specified. Thoracic CT wo contrast: No results found for this or any previous visit.  Thoracic DG 4 views: No results found for this or any previous visit.  Thoracic DG w/swimmers view: No results found for this or any previous visit.   Lumbosacral Imaging: Lumbar MR wo contrast: No results found for this or any previous visit.  Lumbar MR wo contrast: No valid procedures specified. Lumbar CT wo contrast: No results found for this or any previous visit.  Lumbar DG Bending views: Results for orders placed during the hospital encounter of 03/10/23  DG Lumbar Spine Complete W/Bend  Narrative CLINICAL DATA:  Low back pain  EXAM: LUMBAR SPINE - COMPLETE WITH BENDING VIEWS  COMPARISON:  None Available.  FINDINGS: There is no evidence of lumbar spine fracture. Alignment is normal. No change in alignment on flexion extension views. Multilevel mild moderate facet arthropathy and mild osteophyte formation. Intervertebral disc spaces are maintained.  IMPRESSION: No acute displaced fracture or traumatic listhesis of the lumbar spine.   Electronically Signed By: Tish Frederickson M.D. On: 03/12/2023 21:21         Sacroiliac Joint Imaging: Sacroiliac Joint DG: No results found for this or any previous  visit.   Hip Imaging: Hip-R MR wo contrast: No results found for this or any previous visit.  Hip-L MR wo contrast: No results found for this or any previous visit.  Hip-R CT wo contrast: No results found for this or any previous visit.  Hip-L CT wo contrast: No results found for this or any previous visit.  Hip-R DG 2-3 views: No results found for this or any previous visit.  Hip-L DG 2-3 views: Results for orders placed during the hospital encounter of 08/22/22  DG Hip Unilat W OR W/O Pelvis 2-3 Views Left  Narrative CLINICAL DATA:  Left hip pain.  EXAM: DG HIP (WITH OR WITHOUT PELVIS) 2-3V LEFT  COMPARISON:  None Available.  FINDINGS: No acute fracture or dislocation. Nonspecific curvilinear soft tissue calcification adjacent the left greater trochanter. Remainder the exam is unremarkable.  IMPRESSION: No acute findings.   Electronically Signed By: Elberta Fortis M.D. On: 08/24/2022 13:04  Hip-B DG Bilateral: No results found for this or any previous visit.   Knee Imaging: Knee-R MR wo contrast: No results found for this or any previous visit.  Knee-L MR wo contrast: No results found for this or any previous visit.  Knee-R CT wo contrast: No results found for this or any previous visit.  Knee-L CT wo contrast: No results found for this or any previous visit.  Knee-R DG 4 views: No results found for this or any previous visit.  Knee-L DG 4 views: Results for orders placed during the hospital encounter of 08/22/22  DG Knee Complete 4 Views Left  Narrative CLINICAL DATA:  LEFT knee pain.  EXAM: LEFT KNEE - COMPLETE 4+ VIEW  COMPARISON:  None Available.  FINDINGS: No evidence of fracture, dislocation, or joint effusion. No evidence of arthropathy or other focal bone abnormality. Soft tissues are unremarkable.  IMPRESSION: Negative.   Electronically Signed By: Harmon Pier M.D. On: 08/24/2022 13:05   Ankle Imaging: Ankle-R DG Complete: No  results found for this or any previous visit.  Ankle-L DG Complete: Results for orders placed in visit on 08/29/22  DG Ankle Complete Left  Narrative CLINICAL DATA:  Pain and swelling left foot and ankle  EXAM: LEFT ANKLE COMPLETE - 3+ VIEW  COMPARISON:  08/29/2022  FINDINGS: There is no evidence of fracture, dislocation, or joint effusion. There is no evidence of arthropathy or other focal bone abnormality. Soft tissues are unremarkable.  IMPRESSION: Negative.   Electronically Signed By: Judie Petit.  Shick M.D. On: 08/31/2022 11:11   Foot Imaging: Foot-R DG Complete: Results for orders placed in visit on 05/06/17  DG Foot Complete Right  Narrative Please see detailed radiograph report in office note.  Foot-L DG Complete: Results for orders placed in visit on 08/29/22  DG Foot Complete Left  Narrative CLINICAL DATA:  Pain and swelling  EXAM: LEFT FOOT - COMPLETE 3+ VIEW  COMPARISON:  08/29/2022  FINDINGS: There is no evidence of fracture or dislocation. There is no evidence of arthropathy or other focal bone abnormality. Soft tissues are unremarkable.  IMPRESSION: Negative.   Electronically Signed By: Judie Petit.  Shick M.D. On: 08/31/2022 11:12   Elbow Imaging: Elbow-R DG Complete: No results found for this or any previous visit.  Elbow-L DG Complete: No results found for this or any previous visit.   Wrist Imaging: Wrist-R DG Complete: No results found for this or any previous visit.  Wrist-L DG Complete: No results found for this or any previous visit.   Hand Imaging: Hand-R DG Complete: No results found for this or any previous visit.  Hand-L DG Complete: No results found for this or any previous visit.   Complexity Note: Imaging results reviewed.                         Meds   Current Outpatient Medications:    diclofenac (VOLTAREN) 75 MG EC tablet, Take 1 tablet (75 mg total) by mouth 2 (two) times daily as needed., Disp: 60 tablet, Rfl: 0    gabapentin (NEURONTIN) 300 MG capsule, Take 1 capsule (300 mg total) by mouth 2 (two) times daily., Disp: 60 capsule, Rfl: 0   methocarbamol (ROBAXIN) 750 MG tablet, Take 1 tablet (750 mg total) by mouth at bedtime as needed for muscle spasms., Disp: 30 tablet, Rfl: 0   Plecanatide (TRULANCE) 3 MG TABS, Take by mouth., Disp: , Rfl:   ROS  Constitutional: Denies any fever or chills Gastrointestinal: No reported hemesis, hematochezia, vomiting, or acute GI distress Musculoskeletal: Denies any acute onset joint swelling, redness, loss of ROM, or weakness Neurological: No reported episodes of acute onset apraxia, aphasia, dysarthria, agnosia, amnesia, paralysis, loss of coordination, or loss of consciousness  Allergies  Ms. Doughtie has No Known Allergies.  PFSH  Drug: Ms. Bennetts  reports no history of drug use. Alcohol:  reports current alcohol use. Tobacco:  reports that she quit smoking about 18 months ago. Her smoking use included cigarettes. She has a 18.00 pack-year smoking history. She has never used smokeless tobacco. Medical:  has no past medical history on file. Surgical: Ms. Mullins  has a past surgical history that includes Colonoscopy (N/A, 08/17/2021) and Breast biopsy (Right, 10/04/2021). Family: family history includes Cancer in her maternal grandfather and mother; Diabetes in her maternal grandmother; Esophageal cancer in her sister; Heart disease in her maternal grandmother; Hypertension in her father; Stroke in her maternal grandmother.  Constitutional Exam  General appearance: Well nourished, well developed, and well hydrated. In no apparent acute distress There were no vitals filed for this visit. BMI Assessment: Estimated body mass index is 34.54 kg/m as calculated from the following:   Height as of 03/10/23: 5\' 6"  (1.676 m).   Weight as of 03/10/23: 214 lb (97.1  kg).  BMI interpretation table: BMI level Category Range association with higher incidence of chronic pain  <18  kg/m2 Underweight   18.5-24.9 kg/m2 Ideal body weight   25-29.9 kg/m2 Overweight Increased incidence by 20%  30-34.9 kg/m2 Obese (Class I) Increased incidence by 68%  35-39.9 kg/m2 Severe obesity (Class II) Increased incidence by 136%  >40 kg/m2 Extreme obesity (Class III) Increased incidence by 254%   Patient's current BMI Ideal Body weight  There is no height or weight on file to calculate BMI. Ideal body weight: 59.3 kg (130 lb 11.7 oz) Adjusted ideal body weight: 74.4 kg (164 lb 0.6 oz)   BMI Readings from Last 4 Encounters:  03/10/23 34.54 kg/m  02/14/23 36.64 kg/m  02/12/23 36.17 kg/m  02/10/23 36.32 kg/m   Wt Readings from Last 4 Encounters:  03/10/23 214 lb (97.1 kg)  02/14/23 227 lb (103 kg)  02/12/23 224 lb 2 oz (101.7 kg)  02/10/23 225 lb (102.1 kg)    Psych/Mental status: Alert, oriented x 3 (person, place, & time)       Eyes: PERLA Respiratory: No evidence of acute respiratory distress  Assessment & Plan  Primary Diagnosis & Pertinent Problem List: There were no encounter diagnoses.  Visit Diagnosis: No diagnosis found. Problems updated and reviewed during this visit: No problems updated.  Plan of Care  Pharmacotherapy (Medications Ordered): No orders of the defined types were placed in this encounter.  Procedure Orders    No procedure(s) ordered today   Lab Orders  No laboratory test(s) ordered today   Imaging Orders  No imaging studies ordered today   Referral Orders  No referral(s) requested today    Pharmacological management:  Opioid Analgesics: I will not be prescribing any opioids at this time Membrane stabilizer: I will not be prescribing any at this time Muscle relaxant: I will not be prescribing any at this time NSAID: I will not be prescribing any at this time Other analgesic(s): I will not be prescribing any at this time      Interventional Therapies  Risk Factors  Considerations:     Planned  Pending:   See above for  possible orders   Under consideration:   Pending completion of evaluation   Completed:   None at this time   Completed by other providers:   Diagnostic/therapeutic left hip injection x2 (US guidance) (10/03/2022, 02/14/2023) by Jerrol Banana, MD ([facility])    Therapeutic  Palliative (PRN) options:   None established        Provider-requested follow-up: No follow-ups on file. Recent Visits Date Type Provider Dept  03/10/23 Office Visit Delano Metz, MD [facility]  Showing recent visits within past 90 days and meeting all other requirements Future Appointments Date Type Provider Dept  03/24/23 Appointment Delano Metz, MD [facility]  Showing future appointments within next 90 days and meeting all other requirements   Primary Care Physician: Reubin Milan, MD  Duration of encounter: *** minutes.  Total time on encounter, as per AMA guidelines included both the face-to-face and non-face-to-face time personally spent by the physician and/or other qualified health care professional(s) on the day of the encounter (includes time in activities that require the physician or other qualified health care professional and does not include time in activities normally performed by clinical staff). Physician's time may include the following activities when performed: Preparing to see the patient (e.g., pre-charting review of records, searching for previously ordered imaging, lab work, and nerve  conduction tests) Review of prior analgesic pharmacotherapies. Reviewing PMP Interpreting ordered tests (e.g., lab work, imaging, nerve conduction tests) Performing post-procedure evaluations, including interpretation of diagnostic procedures Obtaining and/or reviewing separately obtained history Performing a medically appropriate examination and/or evaluation Counseling and educating the patient/family/caregiver Ordering medications, tests, or  procedures Referring and communicating with other health care professionals (when not separately reported) Documenting clinical information in the electronic or other health record Independently interpreting results (not separately reported) and communicating results to the patient/ family/caregiver Care coordination (not separately reported)  Note by: Oswaldo Done, MD (TTS technology used. I apologize for any typographical errors that were not detected and corrected.) Date: 03/24/2023; Time: 5:28 PM

## 2023-03-24 ENCOUNTER — Encounter: Payer: Self-pay | Admitting: Pain Medicine

## 2023-03-24 ENCOUNTER — Ambulatory Visit: Payer: BC Managed Care – PPO | Attending: Pain Medicine | Admitting: Pain Medicine

## 2023-03-24 VITALS — BP 140/72 | HR 87 | Temp 97.9°F | Resp 14 | Ht 66.0 in | Wt 215.0 lb

## 2023-03-24 DIAGNOSIS — G8929 Other chronic pain: Secondary | ICD-10-CM | POA: Insufficient documentation

## 2023-03-24 DIAGNOSIS — M5459 Other low back pain: Secondary | ICD-10-CM | POA: Diagnosis not present

## 2023-03-24 DIAGNOSIS — E559 Vitamin D deficiency, unspecified: Secondary | ICD-10-CM | POA: Insufficient documentation

## 2023-03-24 DIAGNOSIS — M47817 Spondylosis without myelopathy or radiculopathy, lumbosacral region: Secondary | ICD-10-CM | POA: Diagnosis not present

## 2023-03-24 DIAGNOSIS — M545 Low back pain, unspecified: Secondary | ICD-10-CM | POA: Insufficient documentation

## 2023-03-24 DIAGNOSIS — M79605 Pain in left leg: Secondary | ICD-10-CM | POA: Diagnosis not present

## 2023-03-24 MED ORDER — ERGOCALCIFEROL 1.25 MG (50000 UT) PO CAPS: 50000.0000 [IU] | ORAL_CAPSULE | ORAL | 0 refills | Status: AC

## 2023-03-24 MED ORDER — VITAMIN D3 125 MCG (5000 UT) PO CAPS
5000.0000 [IU] | ORAL_CAPSULE | Freq: Every day | ORAL | 0 refills | Status: AC
Start: 2023-03-24 — End: 2023-06-22

## 2023-03-24 NOTE — Patient Instructions (Signed)

## 2023-03-27 ENCOUNTER — Other Ambulatory Visit: Payer: Self-pay | Admitting: Family Medicine

## 2023-03-27 DIAGNOSIS — G8929 Other chronic pain: Secondary | ICD-10-CM

## 2023-03-27 DIAGNOSIS — M25552 Pain in left hip: Secondary | ICD-10-CM

## 2023-03-27 DIAGNOSIS — M25572 Pain in left ankle and joints of left foot: Secondary | ICD-10-CM

## 2023-03-28 ENCOUNTER — Other Ambulatory Visit: Payer: Self-pay | Admitting: Family Medicine

## 2023-03-28 DIAGNOSIS — M25572 Pain in left ankle and joints of left foot: Secondary | ICD-10-CM

## 2023-03-28 DIAGNOSIS — G8929 Other chronic pain: Secondary | ICD-10-CM

## 2023-03-28 DIAGNOSIS — M25552 Pain in left hip: Secondary | ICD-10-CM

## 2023-03-28 NOTE — Telephone Encounter (Signed)
Refill

## 2023-03-31 ENCOUNTER — Other Ambulatory Visit: Payer: Self-pay | Admitting: Family Medicine

## 2023-03-31 DIAGNOSIS — M25572 Pain in left ankle and joints of left foot: Secondary | ICD-10-CM

## 2023-03-31 DIAGNOSIS — M25552 Pain in left hip: Secondary | ICD-10-CM

## 2023-03-31 DIAGNOSIS — G8929 Other chronic pain: Secondary | ICD-10-CM

## 2023-03-31 NOTE — Telephone Encounter (Signed)
Requested medication (s) are due for refill today: yes  Requested medication (s) are on the active medication list: yes  Last refill:  02/03/23 #30/0  Future visit scheduled: yes  Notes to clinic:  Unable to refill per protocol, cannot delegate. To Dr. Ashley Royalty for refill.     Requested Prescriptions  Pending Prescriptions Disp Refills   methocarbamol (ROBAXIN) 750 MG tablet [Pharmacy Med Name: METHOCARBAMOL 750 MG TABLET] 30 tablet 0    Sig: Take1 tablet (750 mg total) by mouth at bedtime as needed for muscle spasms.     Not Delegated - Analgesics:  Muscle Relaxants Failed - 03/31/2023 10:14 AM      Failed - This refill cannot be delegated      Passed - Valid encounter within last 6 months    Recent Outpatient Visits           1 month ago Arthralgia of left hip   [place] [facility], Ocie Bob, MD   1 month ago Prediabetes   [facility] at [facility], Nyoka Cowden, MD   1 month ago Arthralgia of left hip   [place] [facility] at [facility], Ocie Bob, MD   5 months ago Arthralgia of left hip   [facility] Medicine at [facility], Ocie Bob, MD   6 months ago Chronic idiopathic constipation   [facility] Medicine at [facility], Nyoka Cowden, MD       Future Appointments             In 2 weeks Ashley Royalty, Ocie Bob, MD [facility] Primary Care & Sports Medicine at [facility], [facility]   In 5 months Reubin Milan, MD [facility] Primary Care & Sports Medicine at [facility], [facility]            Signed Prescriptions Disp Refills   gabapentin (NEURONTIN) 300 MG capsule 60 capsule 2    Sig: Take 1 capsule (300 mg total) by mouth 2 (two) times daily.     Neurology: Anticonvulsants - gabapentin Failed - 03/31/2023 10:14 AM      Failed - Cr in normal range and within 360 days     Creatinine, Ser  Date Value Ref Range Status  03/10/2023 0.55 (L) 0.57 - 1.00 mg/dL Final         Passed - Completed PHQ-2 or PHQ-9 in the last 360 days      Passed - Valid encounter within last 12 months    Recent Outpatient Visits           1 month ago Arthralgia of left hip   [place] [facility] at [facility], Ocie Bob, MD   1 month ago Prediabetes   [facility] at [facility], Nyoka Cowden, MD   1 month ago Arthralgia of left hip   [facility] & [facility] at [facility], Ocie Bob, MD   5 months ago Arthralgia of left hip   [facility] Primary [facility] & [facility] at [facility], Ocie Bob, MD   6 months ago Chronic idiopathic constipation   [facility] & [facility] at [facility], Nyoka Cowden, MD       Future Appointments  In 2 weeks Ashley Royalty, Ocie Bob, MD [facility] & [facility] Medicine at [facility], [facility]   In 5 months Judithann Graves, Nyoka Cowden, MD [facility] Primary Care & Sports Medicine at [facility], [facility]

## 2023-03-31 NOTE — Telephone Encounter (Signed)
Please advise 

## 2023-03-31 NOTE — Telephone Encounter (Signed)
Requested Prescriptions  Pending Prescriptions Disp Refills   methocarbamol (ROBAXIN) 750 MG tablet [Pharmacy Med Name: METHOCARBAMOL 750 MG TABLET] 30 tablet 0    Sig: Take1 tablet (750 mg total) by mouth at bedtime as needed for muscle spasms.     Not Delegated - Analgesics:  Muscle Relaxants Failed - 03/31/2023 10:14 AM      Failed - This refill cannot be delegated      Passed - Valid encounter within last 6 months    Recent Outpatient Visits           1 month ago Arthralgia of left hip   [place] [facility] at [facility], Ocie Bob, MD   1 month ago Prediabetes   [facility] & Sports Medicine at [facility], Nyoka Cowden, MD   1 month ago Arthralgia of left hip   [place] [facility] at [facility], Ocie Bob, MD   5 months ago Arthralgia of left hip   [facility] Primary [facility] & [facility] Medicine at [facility], Ocie Bob, MD   6 months ago Chronic idiopathic constipation   [facility] Medicine at [facility], Nyoka Cowden, MD       Future Appointments             In 2 weeks Ashley Royalty, Ocie Bob, MD [facility] Primary Care & Sports Medicine at [facility], [facility]   In 5 months Reubin Milan, MD [facility] Primary Care & Sports Medicine at [facility], [facility]             gabapentin (NEURONTIN) 300 MG capsule [Pharmacy Med Name: GABAPENTIN 300 MG CAPSULE] 60 capsule 0    Sig: Take 1 capsule (300 mg total) by mouth 2 (two) times daily.     Neurology: Anticonvulsants - gabapentin Failed - 03/31/2023 10:14 AM      Failed - Cr in normal range and within 360 days    Creatinine, Ser  Date Value Ref Range Status  03/10/2023 0.55 (L) 0.57 - 1.00 mg/dL Final         Passed - Completed PHQ-2 or PHQ-9 in the last 360 days      Passed - Valid encounter within last 12 months    Recent Outpatient Visits           1 month ago Arthralgia of  left hip   [place] [facility] at [facility], Ocie Bob, MD   1 month ago Prediabetes   [facility] Care & Sports Medicine at [facility], Nyoka Cowden, MD   1 month ago Arthralgia of left hip   [place] [facility] at [facility], Ocie Bob, MD   5 months ago Arthralgia of left hip   [facility] & Sports Medicine at [facility], Ocie Bob, MD   6 months ago Chronic idiopathic constipation   [facility] Primary Care & Sports Medicine at [facility], Nyoka Cowden, MD       Future Appointments             In 2 weeks Ashley Royalty, Ocie Bob, MD [facility] Primary Care & Sports Medicine at [facility], [facility]   In 5 months Judithann Graves, Nyoka Cowden, MD [facility] Primary Care & Sports Medicine at [facility], [facility]

## 2023-04-01 MED ORDER — METHOCARBAMOL 750 MG PO TABS
750.0000 mg | ORAL_TABLET | Freq: Every evening | ORAL | 0 refills | Status: DC | PRN
Start: 2023-04-01 — End: 2023-05-23

## 2023-04-01 MED ORDER — GABAPENTIN 300 MG PO CAPS
300.0000 mg | ORAL_CAPSULE | Freq: Two times a day (BID) | ORAL | 0 refills | Status: DC
Start: 2023-04-01 — End: 2023-07-14

## 2023-04-01 MED ORDER — METHOCARBAMOL 750 MG PO TABS
750.0000 mg | ORAL_TABLET | Freq: Every evening | ORAL | 0 refills | Status: DC | PRN
Start: 2023-04-01 — End: 2023-04-01

## 2023-04-01 MED ORDER — GABAPENTIN 300 MG PO CAPS: 300.0000 mg | ORAL_CAPSULE | Freq: Two times a day (BID) | ORAL | 0 refills | Status: DC

## 2023-04-01 MED ORDER — DICLOFENAC SODIUM 75 MG PO TBEC: 75.0000 mg | DELAYED_RELEASE_TABLET | Freq: Two times a day (BID) | ORAL | 0 refills | Status: DC | PRN

## 2023-04-07 ENCOUNTER — Ambulatory Visit: Payer: BC Managed Care – PPO | Admitting: Pain Medicine

## 2023-04-18 ENCOUNTER — Ambulatory Visit: Payer: BC Managed Care – PPO | Admitting: Internal Medicine

## 2023-04-18 ENCOUNTER — Ambulatory Visit: Payer: BC Managed Care – PPO | Admitting: Family Medicine

## 2023-04-18 ENCOUNTER — Encounter: Payer: Self-pay | Admitting: Family Medicine

## 2023-04-18 VITALS — BP 130/92 | HR 90 | Ht 66.0 in | Wt 217.0 lb

## 2023-04-18 DIAGNOSIS — M47817 Spondylosis without myelopathy or radiculopathy, lumbosacral region: Secondary | ICD-10-CM | POA: Diagnosis not present

## 2023-04-18 DIAGNOSIS — G8929 Other chronic pain: Secondary | ICD-10-CM | POA: Diagnosis not present

## 2023-04-18 DIAGNOSIS — M25552 Pain in left hip: Secondary | ICD-10-CM

## 2023-04-18 NOTE — Patient Instructions (Signed)
-   Start duloxetine 30 mg (1 capsule) daily x 7 days - After 1 week, increase to 60 mg (2 capsules) daily - Anticipate response in 4-6 weeks - Increase gabapentin to 300 mg three times daily (as-needed) - Look for local PT group and contact us for referral - Continue home based rehab until then - Contact us when needing refills /  for status update

## 2023-04-18 NOTE — Progress Notes (Signed)
     Primary Care / Sports Medicine Office Visit  Patient Information:  Patient ID: ONAWA INGLIS, female DOB: August 25, 1971 Age: 52 y.o. MRN: [id number]   Acire A Monica is a pleasant 52 y.o. female presenting with the following:  Chief Complaint  Patient presents with   Arthralgia of left hip    Still feels the same, injections didn't help     Vitals:   04/18/23 1326 04/18/23 1327  BP: (!) 132/100 (!) 130/92  Pulse: 90   SpO2: 98%    Vitals:   04/18/23 1326  Weight: 217 lb (98.4 kg)  Height: 5\' 6"  (1.676 m)   Body mass index is 35.02 kg/m.  No results found.   Independent interpretation of notes and tests performed by another provider:   None  Procedures performed:   None  Pertinent History, Exam, Impression, and Recommendations:   Leonore was seen today for arthralgia of left hip.  Lumbosacral facet arthropathy (Multilevel) (Bilateral) Assessment & Plan: Chronic, without adequate control, worsened with weightbearing, ADLs.  Has established with pain and spine group, interventional options under consideration, unfortunate with insurance related barriers noted.  Patient continues to actively participate in a home-based rehab program prescribed by our office since 10/03/2022.  Plan as follows: - Start duloxetine 30 mg (1 capsule) daily x 7 days, patient was hesitant due to SNRI category, questions answered, she is amenable now - After 1 week, increase to 60 mg (2 capsules) daily - Increase gabapentin to 300 mg three times daily (as-needed) - Look for local PT group and contact us for referral - Continue home based rehab until then - Contact us when needing refills /  for status update   Chronic hip pain (3ry area of Pain) (Left) Assessment & Plan: Patient has noted improvement in her left anterior hip pain following intra-articular corticosteroid injection performed at last visit (02/14/2023).  She has not noted improvement into her lateral hip pain where there is  concern for greater trochanteric pain syndrome in the setting of lumbosacral chronic pain with left-sided radiation.  See additional assessment(s) for plan details.       Orders & Medications No orders of the defined types were placed in this encounter.  No orders of the defined types were placed in this encounter.    No follow-ups on file.     Jerrol Banana, MD, [facility]   Primary Care Sports Medicine Primary Care and Sports Medicine at [facility]

## 2023-04-18 NOTE — Assessment & Plan Note (Signed)
Chronic, without adequate control, worsened with weightbearing, ADLs.  Has established with pain and spine group, interventional options under consideration, unfortunate with insurance related barriers noted.  Patient continues to actively participate in a home-based rehab program prescribed by our office since 10/03/2022.  Plan as follows: - Start duloxetine 30 mg (1 capsule) daily x 7 days, patient was hesitant due to SNRI category, questions answered, she is amenable now - After 1 week, increase to 60 mg (2 capsules) daily - Increase gabapentin to 300 mg three times daily (as-needed) - Look for local PT group and contact us for referral - Continue home based rehab until then - Contact us when needing refills /  for status update

## 2023-04-18 NOTE — Assessment & Plan Note (Signed)
Patient has noted improvement in her left anterior hip pain following intra-articular corticosteroid injection performed at last visit (02/14/2023).  She has not noted improvement into her lateral hip pain where there is concern for greater trochanteric pain syndrome in the setting of lumbosacral chronic pain with left-sided radiation.  See additional assessment(s) for plan details.

## 2023-05-07 ENCOUNTER — Other Ambulatory Visit: Payer: Self-pay | Admitting: Family Medicine

## 2023-05-07 DIAGNOSIS — M25552 Pain in left hip: Secondary | ICD-10-CM

## 2023-05-07 DIAGNOSIS — G8929 Other chronic pain: Secondary | ICD-10-CM

## 2023-05-07 DIAGNOSIS — M25572 Pain in left ankle and joints of left foot: Secondary | ICD-10-CM

## 2023-05-08 ENCOUNTER — Encounter: Payer: Self-pay | Admitting: Family Medicine

## 2023-05-23 ENCOUNTER — Other Ambulatory Visit: Payer: Self-pay

## 2023-05-23 DIAGNOSIS — M25552 Pain in left hip: Secondary | ICD-10-CM

## 2023-05-23 DIAGNOSIS — G8929 Other chronic pain: Secondary | ICD-10-CM

## 2023-05-23 DIAGNOSIS — M25572 Pain in left ankle and joints of left foot: Secondary | ICD-10-CM

## 2023-05-23 MED ORDER — DICLOFENAC SODIUM 75 MG PO TBEC
75.0000 mg | DELAYED_RELEASE_TABLET | Freq: Two times a day (BID) | ORAL | 1 refills | Status: DC | PRN
Start: 2023-05-23 — End: 2023-07-14

## 2023-05-23 MED ORDER — METHOCARBAMOL 750 MG PO TABS
750.0000 mg | ORAL_TABLET | Freq: Every evening | ORAL | 1 refills | Status: DC | PRN
Start: 2023-05-23 — End: 2023-07-14

## 2023-07-08 ENCOUNTER — Encounter: Payer: Self-pay | Admitting: Internal Medicine

## 2023-07-14 ENCOUNTER — Ambulatory Visit: Payer: BC Managed Care – PPO | Admitting: Internal Medicine

## 2023-07-14 ENCOUNTER — Encounter: Payer: Self-pay | Admitting: Internal Medicine

## 2023-07-14 VITALS — BP 126/80 | HR 68 | Ht 66.0 in | Wt 223.0 lb

## 2023-07-14 DIAGNOSIS — M25552 Pain in left hip: Secondary | ICD-10-CM

## 2023-07-14 DIAGNOSIS — E559 Vitamin D deficiency, unspecified: Secondary | ICD-10-CM

## 2023-07-14 DIAGNOSIS — R928 Other abnormal and inconclusive findings on diagnostic imaging of breast: Secondary | ICD-10-CM | POA: Diagnosis not present

## 2023-07-14 DIAGNOSIS — G8929 Other chronic pain: Secondary | ICD-10-CM

## 2023-07-14 DIAGNOSIS — L659 Nonscarring hair loss, unspecified: Secondary | ICD-10-CM

## 2023-07-14 MED ORDER — DICLOFENAC SODIUM 75 MG PO TBEC: 75.0000 mg | DELAYED_RELEASE_TABLET | Freq: Two times a day (BID) | ORAL | 1 refills | Status: DC | PRN

## 2023-07-14 NOTE — Assessment & Plan Note (Addendum)
Taking vitamin D 5000 international units daily since low levels in March Recommend daily supplement indefinitely unless levels are high

## 2023-07-14 NOTE — Patient Instructions (Signed)
Call [facility] to schedule your mammogram at [phone].  

## 2023-07-14 NOTE — Progress Notes (Signed)
Date:  07/14/2023   Name:  Jessica Robles   DOB:  [birth year]   MRN:  [id number]   Chief Complaint: Alopecia (Noticed hair has been falling out for 3 weeks now. )  Hip Pain  There was no injury mechanism. The pain is present in the left hip. The quality of the pain is described as aching. The pain is moderate. The pain has been Fluctuating since onset. She has tried NSAIDs (muscle relaxants/gabapentin) for the symptoms. The treatment provided moderate relief.   Hair loss - over the past three weeks she has noticed more hair falling out.  No clumps, bald spots or scalp issues.  No recent stress.  Not taking any new medications or vitamins.  Right axillary tenderness - thought she felt a lymph node last week.  Now much smaller but slightly tender.  Hx of abnormal mammogram.  Repeat is due in September.  Lab Results  Component Value Date   NA 140 03/10/2023   K 3.8 03/10/2023   CO2 22 08/06/2022   GLUCOSE 95 03/10/2023   BUN 12 03/10/2023   CREATININE 0.55 (L) 03/10/2023   CALCIUM 9.2 03/10/2023   EGFR 110 03/10/2023   Lab Results  Component Value Date   CHOL 191 02/10/2023   HDL 54 02/10/2023   LDLCALC 120 (H) 02/10/2023   TRIG 96 02/10/2023   CHOLHDL 3.5 02/10/2023   Lab Results  Component Value Date   TSH 1.710 07/18/2021   Lab Results  Component Value Date   HGBA1C 5.4 02/10/2023   Lab Results  Component Value Date   WBC 6.5 08/06/2022   HGB 14.2 08/06/2022   HCT 42.3 08/06/2022   MCV 89 08/06/2022   PLT 301 08/06/2022   Lab Results  Component Value Date   ALT 47 (H) 08/06/2022   AST 24 03/10/2023   ALKPHOS 81 03/10/2023   BILITOT 0.3 03/10/2023   Lab Results  Component Value Date   25OHVITD2 <1.0 03/10/2023   25OHVITD3 19 03/10/2023     Review of Systems  Constitutional:  Negative for chills, fatigue and fever.  HENT:  Negative for trouble swallowing.   Respiratory:  Negative for chest tightness and shortness of breath.   Cardiovascular:  Negative  for chest pain.  Gastrointestinal:  Negative for abdominal pain.  Musculoskeletal:  Positive for arthralgias (left hip, low back and ankle).  Psychiatric/Behavioral:  Positive for sleep disturbance. Negative for dysphoric mood. The patient is not nervous/anxious.     Patient Active Problem List   Diagnosis Date Noted   Lumbosacral facet arthropathy (Multilevel) (Bilateral) 03/24/2023   Lumbosacral facet syndrome (Left) 03/24/2023   Lumbar facet joint pain 03/24/2023   Chronic low back pain (1ry area of Pain) (Bilateral) w/o sciatica 03/24/2023   Vitamin D deficiency 03/24/2023   Abnormal drug screen (03/10/2023) 03/13/2023   Marijuana use 03/13/2023   Chronic pain syndrome 03/10/2023   Pharmacologic therapy 03/10/2023   Disorder of skeletal system 03/10/2023   Problems influencing health status 03/10/2023   Chronic lower extremity pain (2ry area of Pain) (Left) 03/10/2023   Chronic hip pain (3ry area of Pain) (Left) 03/10/2023   Hyperlipidemia, mixed 02/10/2023   BMI 36.0-36.9,adult 02/10/2023   Arthralgia of hip (Left) 10/03/2022   Chronic idiopathic constipation 09/30/2022   Greater trochanteric pain syndrome (Left) 08/22/2022   Chronic low back pain (1ry area of Pain) (Bilateral) (L>R) w/ sciatica (Left) 08/22/2022   Arthralgia of knee (Left) 08/22/2022   Peroneal tendinitis of left lower  leg 08/22/2022   Abnormal mammogram of right breast 08/05/2022   Cervical radiculopathy 03/07/2021    No Known Allergies  Past Surgical History:  Procedure Laterality Date   BREAST BIOPSY Right 10/04/2021   u/s bx, 9:00 "heart" clip-path pending   COLONOSCOPY N/A 08/17/2021   Procedure: COLONOSCOPY;  Surgeon: Wyline Mood, MD;  [facility] END[facility];  Service: Gastroenterology;  Laterality: N/A;    Social History   Tobacco Use   Smoking status: Every Day    Types: E-cigarettes   Smokeless tobacco: Never  Vaping Use   Vaping status: Every Day   Start date: 09/15/2021    Substances: Nicotine, Flavoring  Substance Use Topics   Alcohol use: Yes    Comment: occasional   Drug use: Never     Medication list has been reviewed and updated.  Current Meds  Medication Sig   NON FORMULARY CBD gummies at bedtime   [DISCONTINUED] diclofenac (VOLTAREN) 75 MG EC tablet Take 1 tablet (75 mg total) by mouth 2 (two) times daily as needed.   [DISCONTINUED] gabapentin (NEURONTIN) 300 MG capsule Take 1 capsule (300 mg total) by mouth 2 (two) times daily.   [DISCONTINUED] methocarbamol (ROBAXIN) 750 MG tablet Take 1 tablet (750 mg total) by mouth at bedtime as needed for muscle spasms.       07/14/2023    8:45 AM 02/14/2023    1:55 PM 02/10/2023    1:20 PM 09/30/2022    9:04 AM  GAD 7 : Generalized Anxiety Score  Nervous, Anxious, on Edge 0 0 0 0  Control/stop worrying 0 0 0 0  Worry too much - different things 0 0 0 0  Trouble relaxing 0 0 0 0  Restless 0 0 0 0  Easily annoyed or irritable 0 0 0 1  Afraid - awful might happen 0 0 0 0  Total GAD 7 Score 0 0 0 1  Anxiety Difficulty Not difficult at all Not difficult at all Not difficult at all Not difficult at all       07/14/2023    8:45 AM 03/24/2023   12:46 PM 02/14/2023    1:55 PM  Depression screen PHQ 2/9  Decreased Interest 0 0 0  Down, Depressed, Hopeless 0 0 0  PHQ - 2 Score 0 0 0  Altered sleeping 3  3  Tired, decreased energy 3  0  Change in appetite 0  0  Feeling bad or failure about yourself  0  0  Trouble concentrating 0  0  Moving slowly or fidgety/restless 0  0  Suicidal thoughts 0  0  PHQ-9 Score 6  3  Difficult doing work/chores Not difficult at all  Not difficult at all    BP Readings from Last 3 Encounters:  07/14/23 126/80  04/18/23 (!) 130/92  03/24/23 (!) 140/72    Physical Exam Vitals and nursing note reviewed.  Constitutional:      General: She is not in acute distress.    Appearance: She is well-developed.  HENT:     Head: Normocephalic and atraumatic.     Comments:  Scalp normal, no rashes or lesions No alopecia.  Several hairs come out with combing. Cardiovascular:     Rate and Rhythm: Normal rate and regular rhythm.  Pulmonary:     Effort: Pulmonary effort is normal. No respiratory distress.     Breath sounds: No wheezing or rhonchi.  Chest:     Chest wall: No mass.     Comments: No  right axillary mass apprecitated Musculoskeletal:     Cervical back: Normal range of motion.  Skin:    General: Skin is warm and dry.     Findings: No rash.  Neurological:     Mental Status: She is alert and oriented to person, place, and time.  Psychiatric:        Mood and Affect: Mood normal.        Behavior: Behavior normal.     Wt Readings from Last 3 Encounters:  07/14/23 223 lb (101.2 kg)  04/18/23 217 lb (98.4 kg)  03/24/23 215 lb (97.5 kg)    BP 126/80   Pulse 68   Ht 5\' 6"  (1.676 m)   Wt 223 lb (101.2 kg)   SpO2 97%   BMI 35.99 kg/m   Assessment and Plan:  Problem List Items Addressed This Visit       Unprioritized   Vitamin D deficiency    Taking vitamin D 5000 international units daily since low levels in March Recommend daily supplement indefinitely unless levels are high      Relevant Orders   VITAMIN D 25 Hydroxy (Vit-D Deficiency, Fractures)   Chronic hip pain (3ry area of Pain) (Left) (Chronic)    Did not benefit much from muscle relaxants; did not tolerate gabapentin Will continue Voltaren bid      Relevant Medications   diclofenac (VOLTAREN) 75 MG EC tablet   Arthralgia of hip (Left)   Relevant Medications   diclofenac (VOLTAREN) 75 MG EC tablet   Abnormal mammogram of right breast (Chronic)   Relevant Orders   MM 3D SCREENING MAMMOGRAM BILATERAL BREAST   Other Visit Diagnoses     Hair loss    -  Primary   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    Reubin Milan, MD [facility] Primary Care  and Sports Medicine [place]

## 2023-07-14 NOTE — Assessment & Plan Note (Signed)
Did not benefit much from muscle relaxants; did not tolerate gabapentin Will continue Voltaren bid

## 2023-07-22 ENCOUNTER — Other Ambulatory Visit: Payer: Self-pay | Admitting: Internal Medicine

## 2023-07-22 ENCOUNTER — Encounter: Payer: Self-pay | Admitting: Internal Medicine

## 2023-07-22 DIAGNOSIS — N63 Unspecified lump in unspecified breast: Secondary | ICD-10-CM

## 2023-07-22 DIAGNOSIS — R928 Other abnormal and inconclusive findings on diagnostic imaging of breast: Secondary | ICD-10-CM

## 2023-07-22 DIAGNOSIS — Z1231 Encounter for screening mammogram for malignant neoplasm of breast: Secondary | ICD-10-CM

## 2023-07-22 NOTE — Telephone Encounter (Signed)
Please review.  KP

## 2023-08-08 ENCOUNTER — Encounter: Payer: BC Managed Care – PPO | Admitting: Internal Medicine

## 2023-08-20 ENCOUNTER — Ambulatory Visit
Admission: RE | Admit: 2023-08-20 | Discharge: 2023-08-20 | Disposition: A | Discharge status: Home / Self Care | Payer: BC Managed Care – PPO | Source: Ambulatory Visit | Attending: Internal Medicine | Admitting: Internal Medicine

## 2023-08-20 ENCOUNTER — Ambulatory Visit: Admission: RE | Admit: 2023-08-20 | Payer: BC Managed Care – PPO | Source: Ambulatory Visit

## 2023-08-20 DIAGNOSIS — N63 Unspecified lump in unspecified breast: Secondary | ICD-10-CM

## 2023-08-20 DIAGNOSIS — R928 Other abnormal and inconclusive findings on diagnostic imaging of breast: Secondary | ICD-10-CM | POA: Diagnosis not present

## 2023-08-20 DIAGNOSIS — Z1231 Encounter for screening mammogram for malignant neoplasm of breast: Secondary | ICD-10-CM

## 2023-08-20 DIAGNOSIS — N6310 Unspecified lump in the right breast, unspecified quadrant: Secondary | ICD-10-CM | POA: Diagnosis not present

## 2023-08-20 DIAGNOSIS — R92333 Mammographic heterogeneous density, bilateral breasts: Secondary | ICD-10-CM | POA: Diagnosis not present

## 2023-08-20 DIAGNOSIS — N644 Mastodynia: Secondary | ICD-10-CM | POA: Diagnosis not present

## 2023-08-26 IMAGING — MG MM BREAST LOCALIZATION CLIP
4 series · 4 of 12 positions shown · non-contrast
Comparison: Previous exam(s).

CLINICAL DATA: Status post ultrasound-guided biopsy

EXAM:
3D DIAGNOSTIC RIGHT MAMMOGRAM POST ULTRASOUND BIOPSY

[R ML synth-2D]
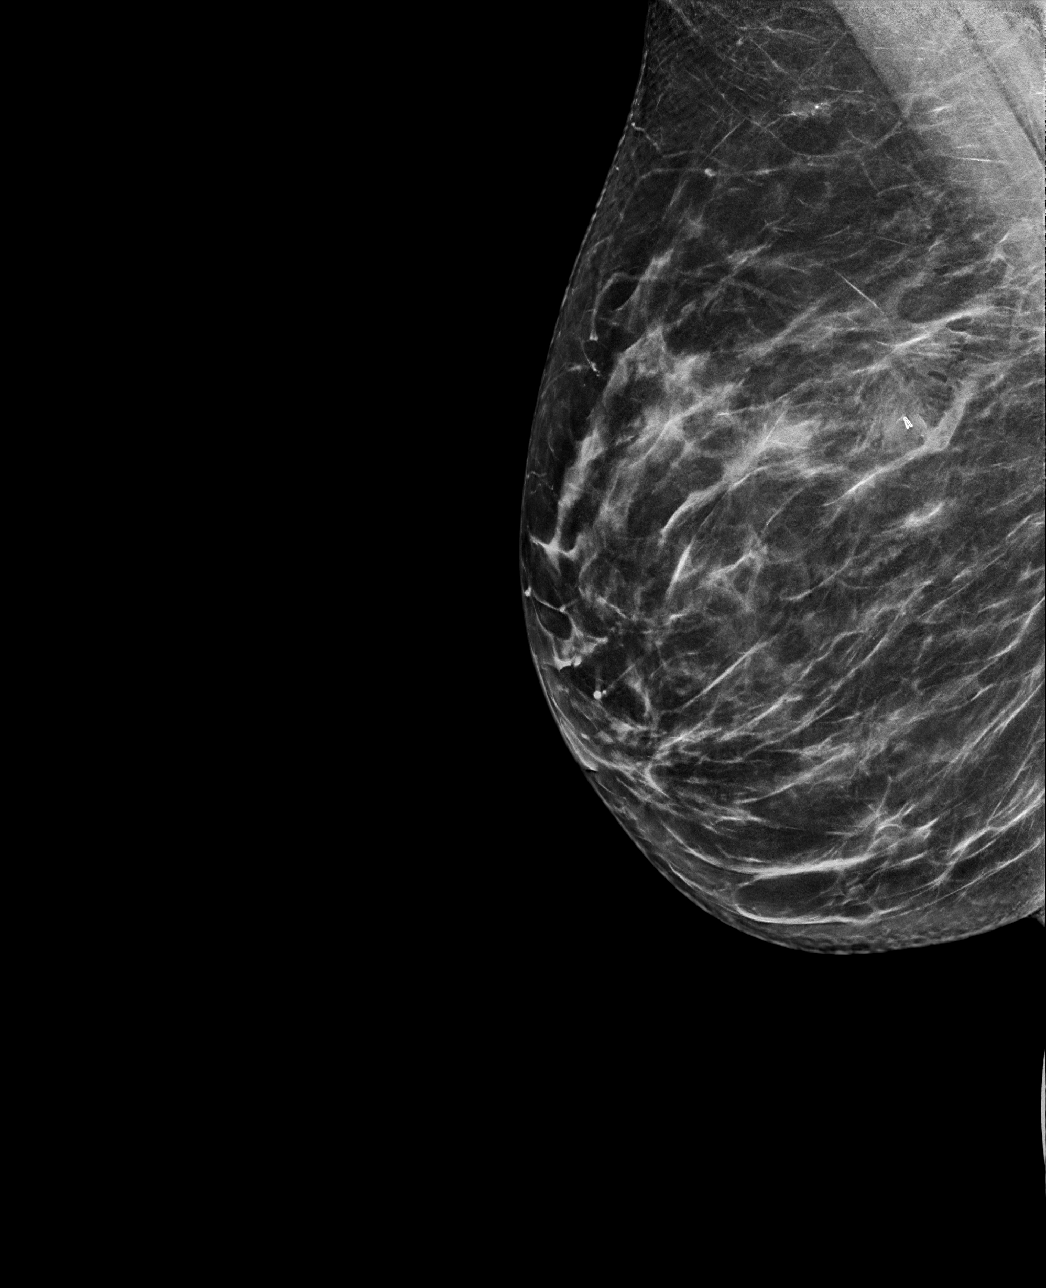

[R CC synth-2D]
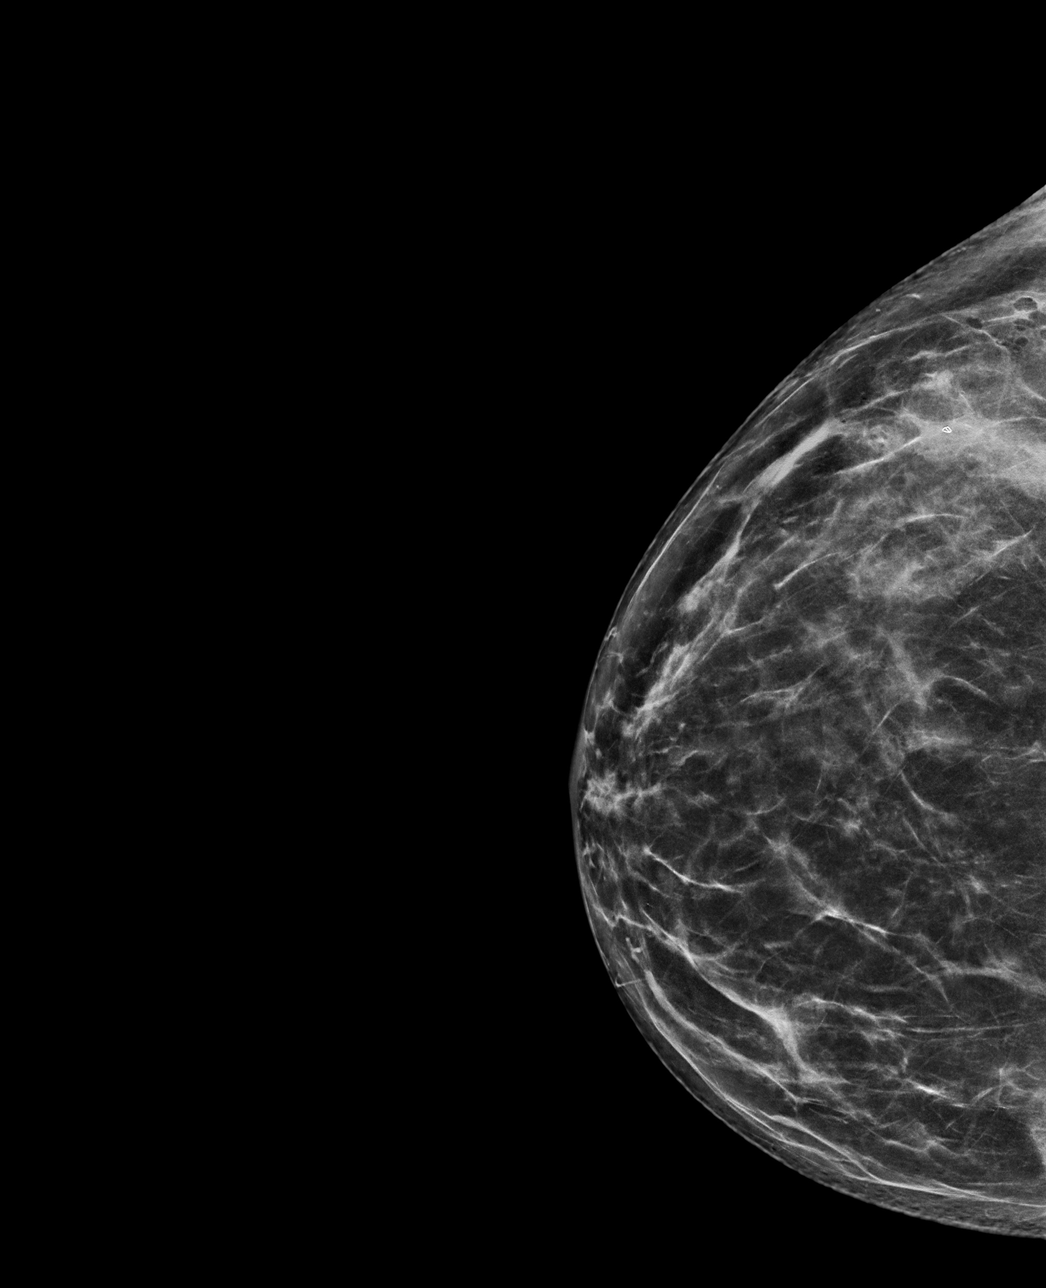

[R CC tomo · tomo slice 43/84.0]
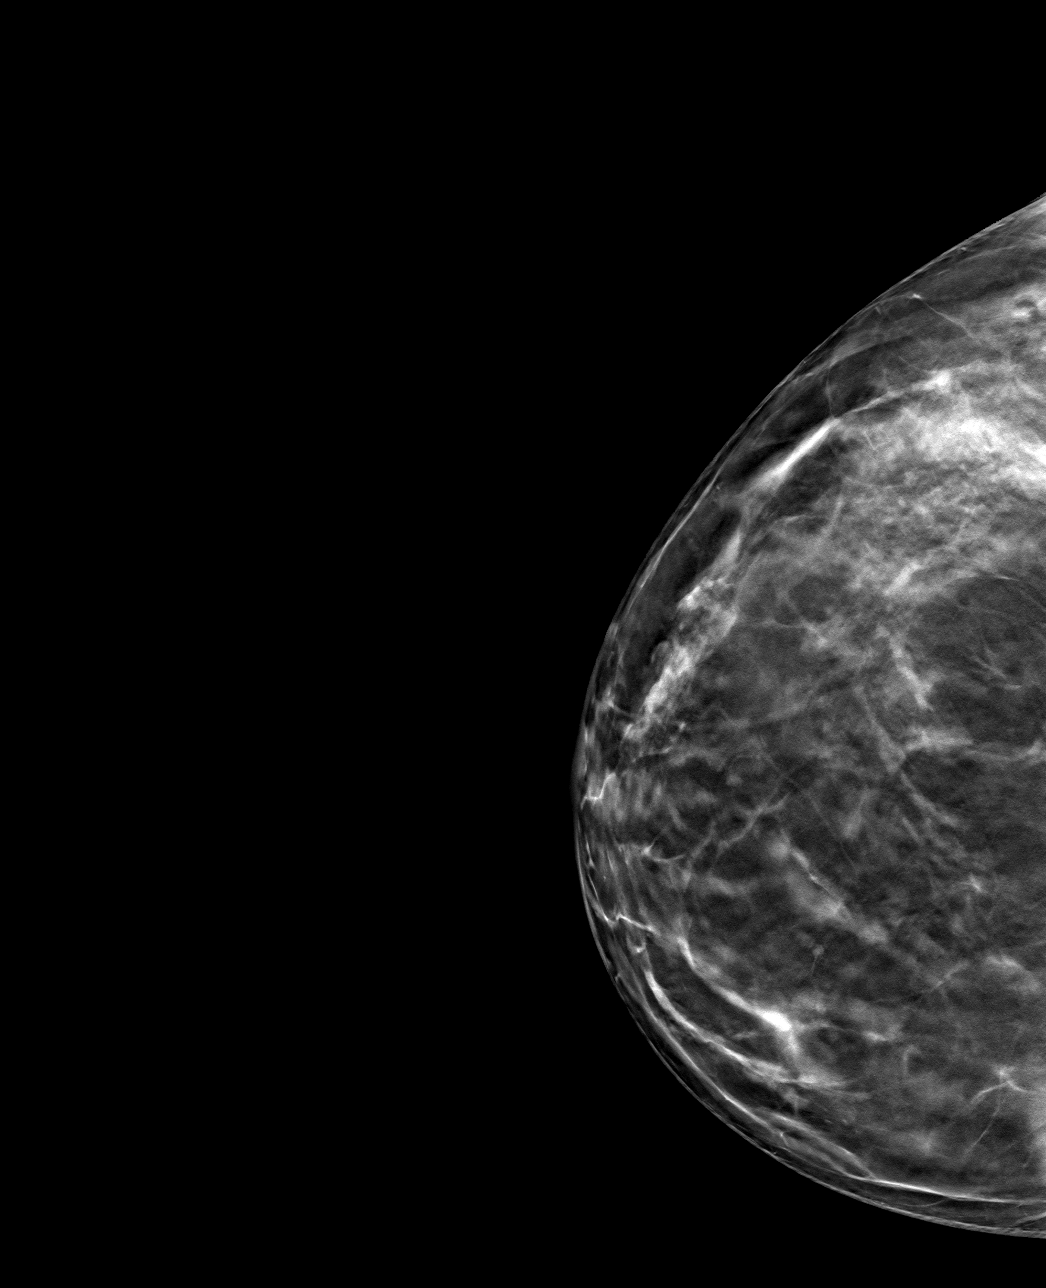

[R ML tomo · tomo slice 43/84.0]
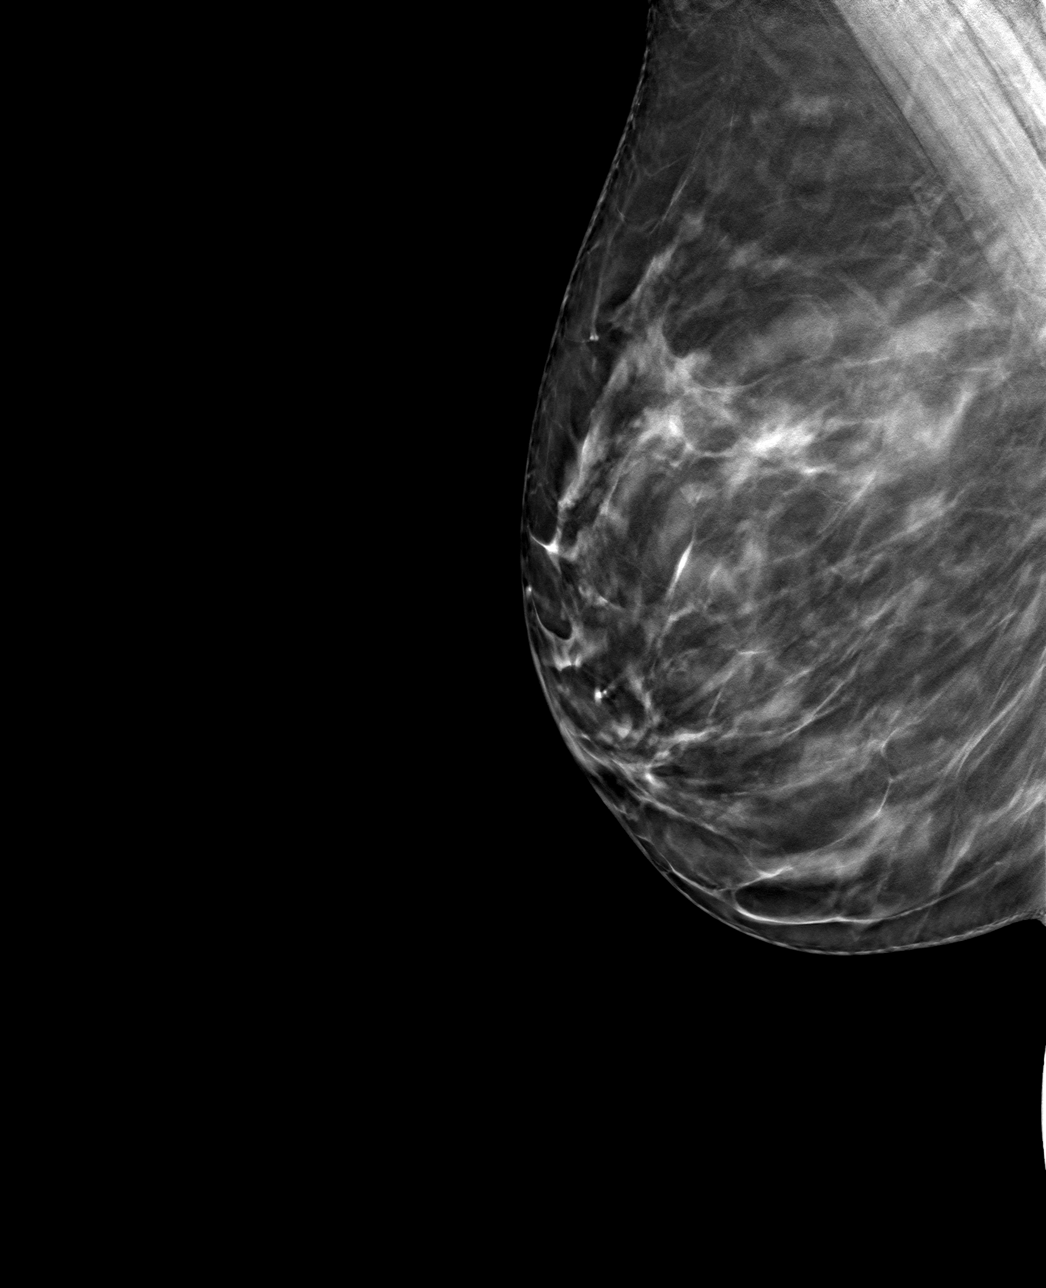

[4 of 12 positions shown; findings below may reference images not displayed]

FINDINGS: 3D Mammographic images were obtained following ultrasound guided
biopsy of a RIGHT breast mass. The heart shaped biopsy marking clip
is in expected position at the site of biopsy.
IMPRESSION: Appropriate positioning of the HEART shaped biopsy marking clip at
the site of biopsy in the RIGHT outer breast.

Final Assessment: Post Procedure Mammograms for Marker Placement

## 2023-08-26 IMAGING — MG US  BREAST BX W/ LOC DEV 1ST LESION IMG BX SPEC US GUIDE*R*
1 series · 8 of 8 positions shown · non-contrast
Comparison: Previous exam(s).
COMPARISON: Previous exam(s).

Addendum:
CLINICAL DATA: Indeterminate RIGHT breast mass, baseline mammogram

EXAM:
ULTRASOUND GUIDED RIGHT BREAST CORE NEEDLE BIOPSY

[Series 1: MG view · 0.07mm/px · 8 of 13 slices shown]
[im 1/13]
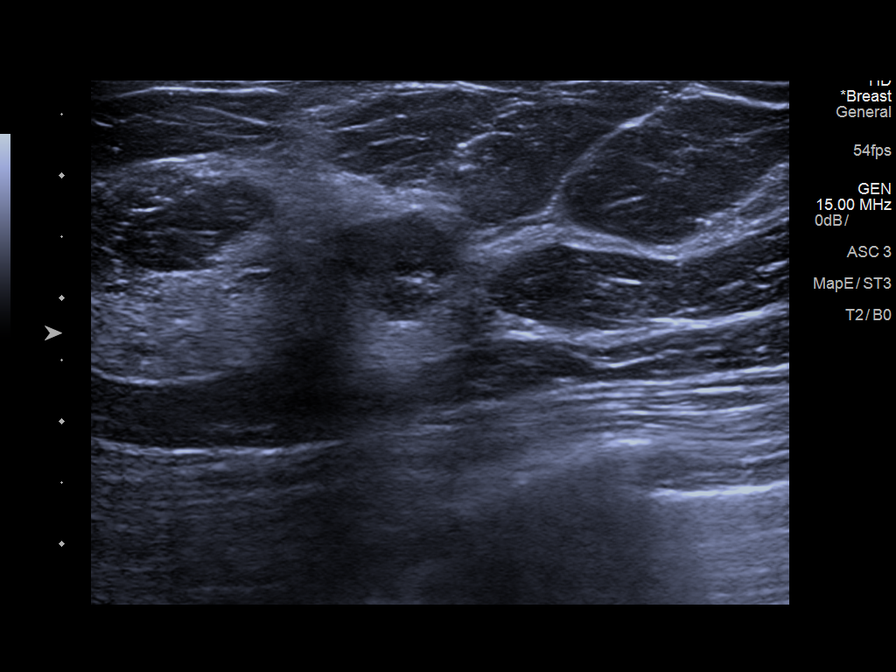
[im 2/13]
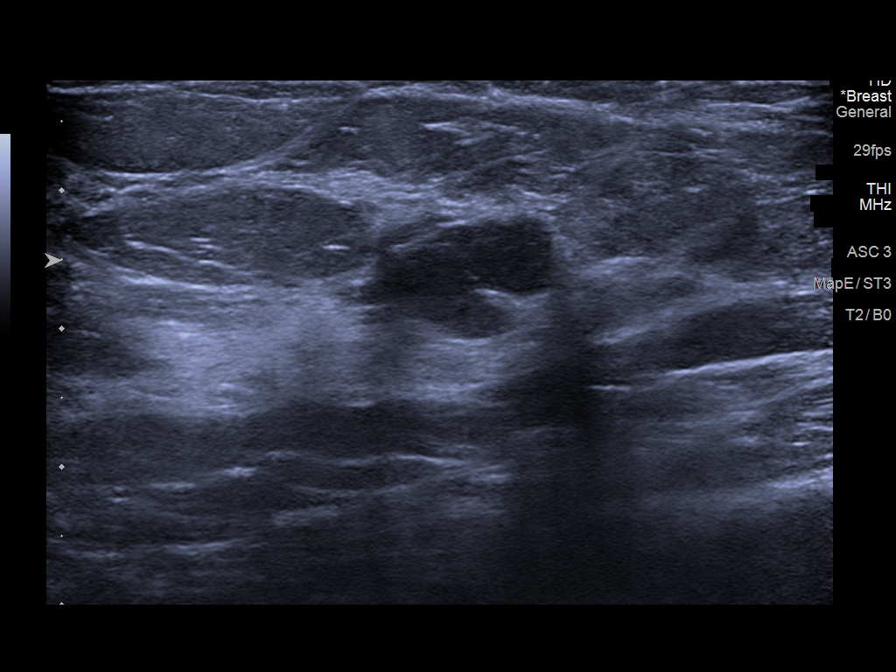
[im 4/13]
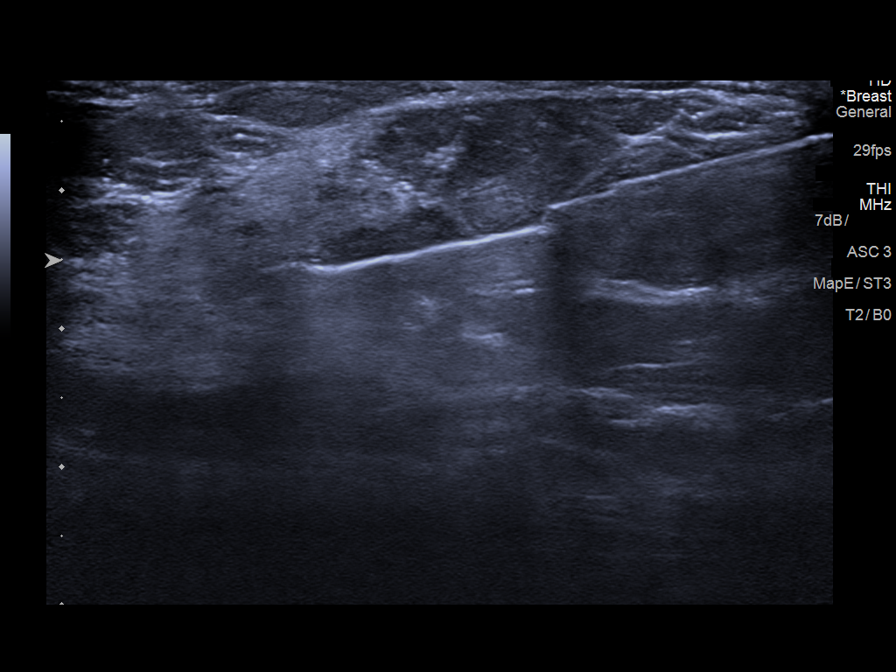
[im 6/13]
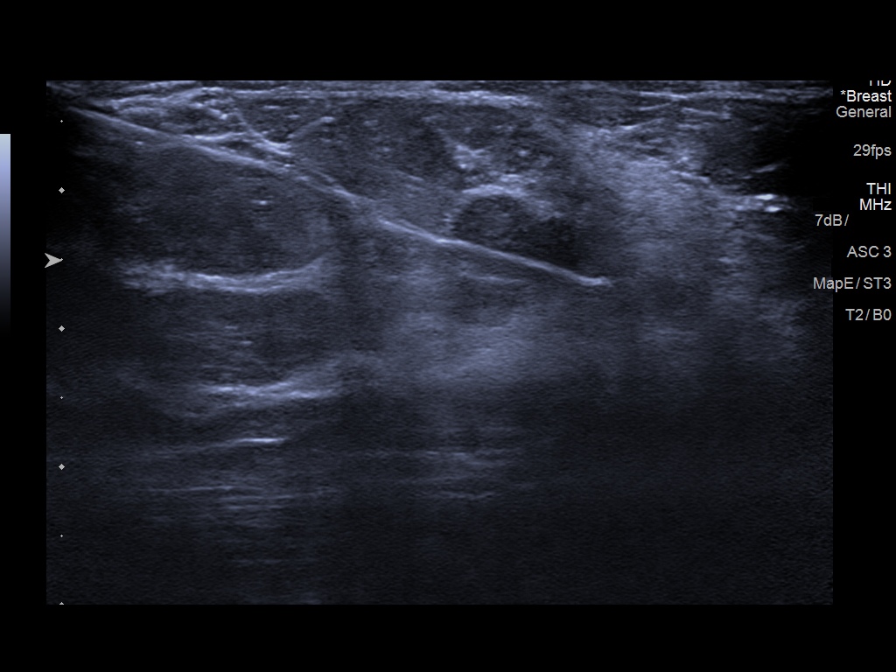
[im 7/13]
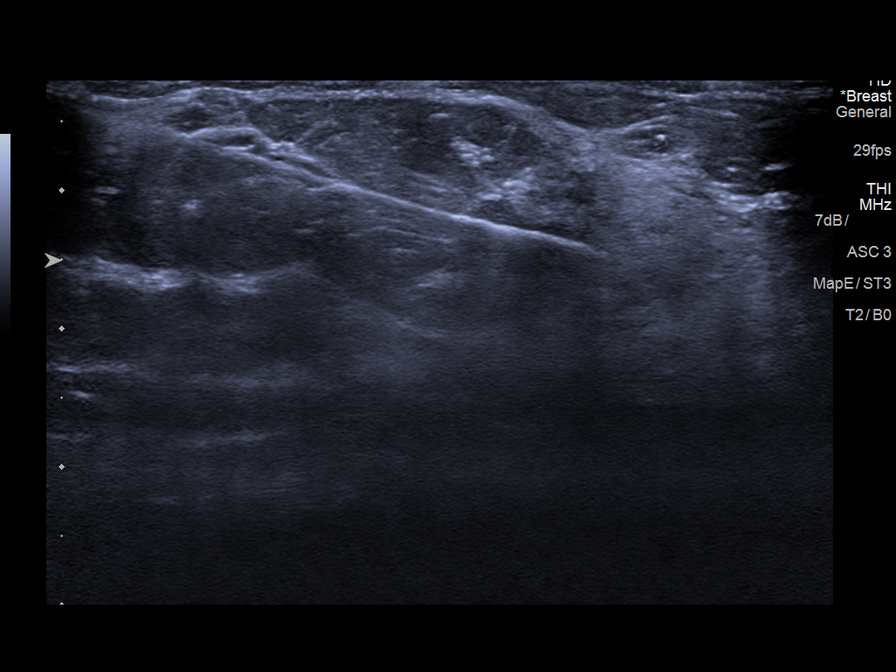
[im 9/13]
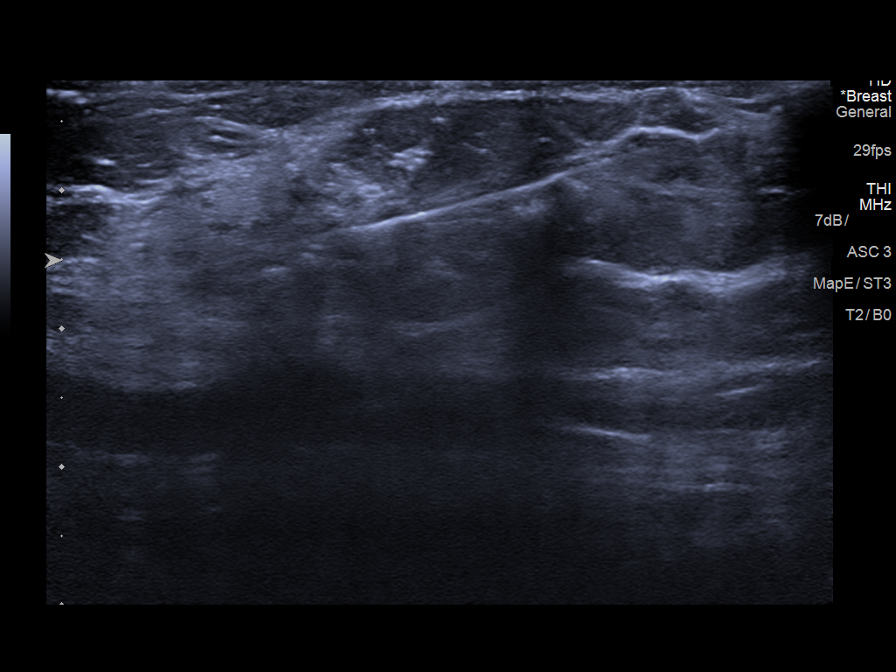
[im 11/13]
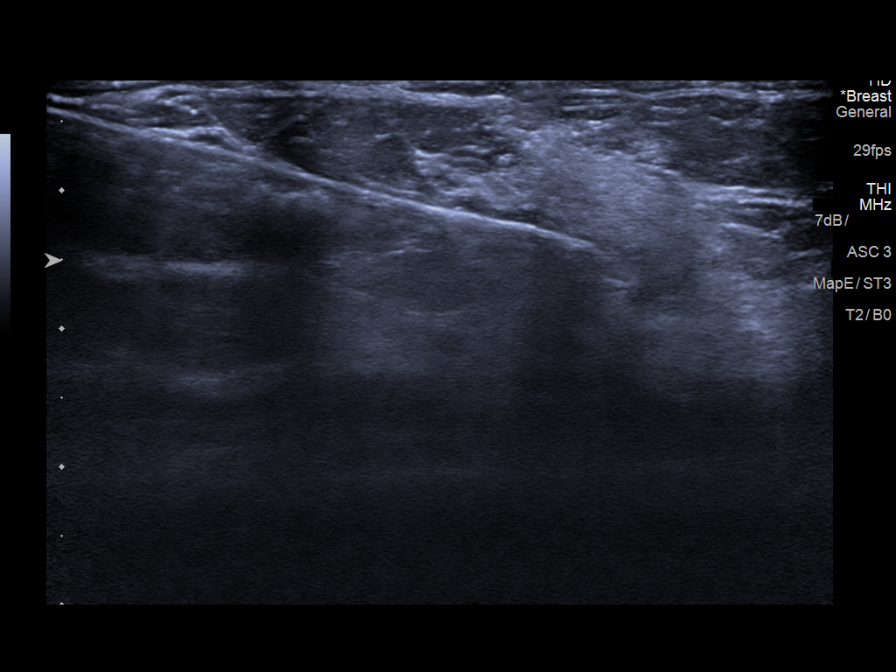
[im 13/13]
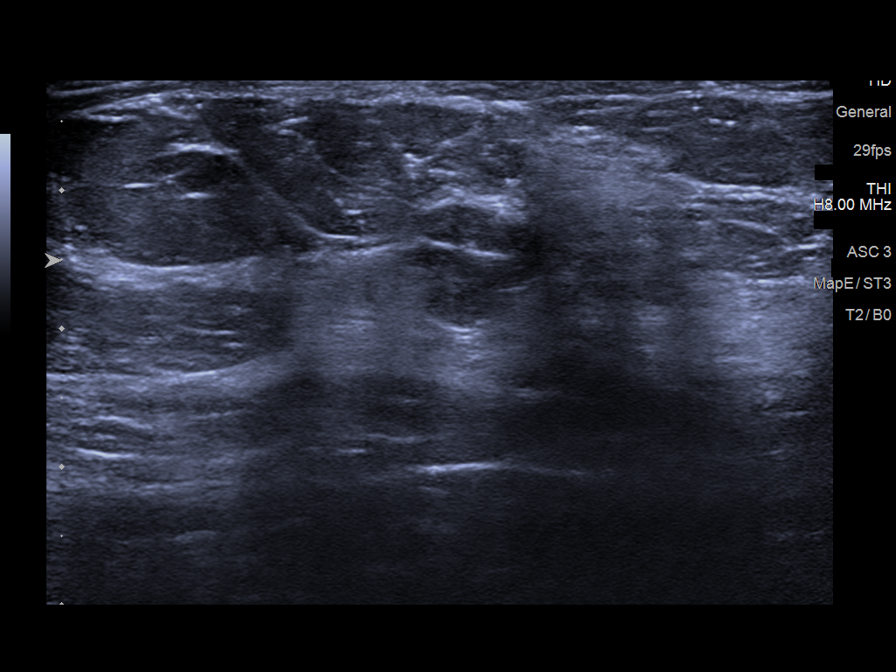

[8 of 8 positions shown; findings below may reference images not displayed]



Lesion quadrant: Upper outer quadrant

Using sterile technique and 1% lidocaine and 1% lidocaine with
epinephrine as local anesthetic, under direct ultrasound
visualization, a 14 gauge Ologo device was used to perform
biopsy of a mass at 9 o'clock using an inferior approach. At the
conclusion of the procedure a HEART shaped tissue marker clip was
deployed into the biopsy cavity. Follow up 2 view mammogram was
performed and dictated separately.
IMPRESSION: Ultrasound guided biopsy of a RIGHT breast mass. No apparent
complications.

ADDENDUM:
PATHOLOGY revealed: A. RIGHT BREAST, [DATE], 8 CMFN; ULTRASOUND-GUIDED
BIOPSY: - FIBROADENOMA. - NEGATIVE FOR ATYPIA AND MALIGNANCY.

Pathology results are CONCORDANT with imaging findings, per Dr.
Bj Agard.

Pathology results and recommendations were discussed with patient
via telephone on 10/05/2021. Patient reported biopsy site doing well
with no adverse symptoms, and only slight tenderness at the site.
Post biopsy care instructions were reviewed, questions were answered
and my direct phone number was provided. Patient was instructed to
call [HOSPITAL] for any additional questions or concerns
related to biopsy site.

RECOMMENDATION: Patient instructed to return in six months for
unilateral RIGHT breast diagnostic mammogram and ultrasound for an
additional BIRADS 3 RIGHT breast mass. Patient informed a reminder
notice will be sent regarding this appointment and she will need to
call mammography site to schedule this appointment.

Pathology results reported by Gena Van Horn RN on 10/08/2021.



Lesion quadrant: Upper outer quadrant

Using sterile technique and 1% lidocaine and 1% lidocaine with
epinephrine as local anesthetic, under direct ultrasound
visualization, a 14 gauge Ologo device was used to perform
biopsy of a mass at 9 o'clock using an inferior approach. At the
conclusion of the procedure a HEART shaped tissue marker clip was
deployed into the biopsy cavity. Follow up 2 view mammogram was
performed and dictated separately.
IMPRESSION: Ultrasound guided biopsy of a RIGHT breast mass. No apparent
complications.

## 2023-09-11 ENCOUNTER — Ambulatory Visit (INDEPENDENT_AMBULATORY_CARE_PROVIDER_SITE_OTHER): Payer: BC Managed Care – PPO | Admitting: Internal Medicine

## 2023-09-11 ENCOUNTER — Encounter: Payer: Self-pay | Admitting: Internal Medicine

## 2023-09-11 VITALS — BP 128/76 | HR 88 | Ht 66.0 in | Wt 222.0 lb

## 2023-09-11 DIAGNOSIS — Z1211 Encounter for screening for malignant neoplasm of colon: Secondary | ICD-10-CM

## 2023-09-11 DIAGNOSIS — Z1231 Encounter for screening mammogram for malignant neoplasm of breast: Secondary | ICD-10-CM

## 2023-09-11 DIAGNOSIS — N951 Menopausal and female climacteric states: Secondary | ICD-10-CM | POA: Insufficient documentation

## 2023-09-11 DIAGNOSIS — Z0000 Encounter for general adult medical examination without abnormal findings: Secondary | ICD-10-CM

## 2023-09-11 DIAGNOSIS — E782 Mixed hyperlipidemia: Secondary | ICD-10-CM

## 2023-09-11 MED ORDER — MEDROXYPROGESTERONE ACETATE 2.5 MG PO TABS
2.5000 mg | ORAL_TABLET | Freq: Every day | ORAL | 1 refills | Status: DC
Start: 2023-09-11 — End: 2024-08-05

## 2023-09-11 MED ORDER — ESTRADIOL 0.5 MG PO TABS
0.5000 mg | ORAL_TABLET | Freq: Every day | ORAL | 1 refills | Status: DC
Start: 2023-09-11 — End: 2024-08-05

## 2023-09-11 NOTE — Assessment & Plan Note (Signed)
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  Has taken OCPs in the past without side effects. Will begin daily HRT .

## 2023-09-11 NOTE — Assessment & Plan Note (Signed)
Lipids managed with diet changes Lab Results  Component Value Date   LDLCALC 120 (H) 02/10/2023

## 2023-09-11 NOTE — Progress Notes (Signed)
Date:  09/11/2023   Name:  Jessica Robles   DOB:  February 23, 1971   MRN:  [id number]   Chief Complaint: Annual Exam Jessica Robles is a 52 y.o. female who presents today for her Complete Annual Exam. She feels well. She reports exercising walk. She reports she is sleeping not well. Breast complaints no. Last menses one year ago - hot flashes are much worse over the past 6 months.  Mammogram: 08/2023 DEXA: none Colonoscopy: 08/2021 repeat 3 yrs - Dr. Tobi Bastos  Health Maintenance Due  Topic Date Due   COVID-19 Vaccine (1) Never done   Pneumococcal Vaccine 85-73 Years old (1 of 2 - PCV) Never done   HIV Screening  Never done   DTaP/Tdap/Td (1 - Tdap) Never done   Zoster Vaccines- Shingrix (1 of 2) Never done     There is no immunization history on file for this patient.  Hyperlipidemia This is a chronic problem. Recent lipid tests were reviewed and are variable. Pertinent negatives include no chest pain or shortness of breath. Current antihyperlipidemic treatment includes diet change and exercise.    Lab Results  Component Value Date   NA 140 03/10/2023   K 3.8 03/10/2023   CO2 22 08/06/2022   GLUCOSE 95 03/10/2023   BUN 12 03/10/2023   CREATININE 0.55 (L) 03/10/2023   CALCIUM 9.2 03/10/2023   EGFR 110 03/10/2023   Lab Results  Component Value Date   CHOL 191 02/10/2023   HDL 54 02/10/2023   LDLCALC 120 (H) 02/10/2023   TRIG 96 02/10/2023   CHOLHDL 3.5 02/10/2023   Lab Results  Component Value Date   TSH 2.320 07/14/2023   Lab Results  Component Value Date   HGBA1C 5.4 02/10/2023   Lab Results  Component Value Date   WBC 6.5 08/06/2022   HGB 14.2 08/06/2022   HCT 42.3 08/06/2022   MCV 89 08/06/2022   PLT 301 08/06/2022   Lab Results  Component Value Date   ALT 47 (H) 08/06/2022   AST 24 03/10/2023   ALKPHOS 81 03/10/2023   BILITOT 0.3 03/10/2023   Lab Results  Component Value Date   25OHVITD2 <1.0 03/10/2023   25OHVITD3 19 03/10/2023   VD25OH 46.9  07/14/2023     Review of Systems  Constitutional:  Positive for diaphoresis. Negative for chills, fatigue and fever.  HENT:  Negative for congestion, hearing loss, tinnitus, trouble swallowing and voice change.   Eyes:  Negative for visual disturbance.  Respiratory:  Negative for cough, chest tightness, shortness of breath and wheezing.   Cardiovascular:  Negative for chest pain, palpitations and leg swelling.  Gastrointestinal:  Negative for abdominal pain, constipation, diarrhea and vomiting.  Endocrine: Positive for heat intolerance. Negative for polydipsia and polyuria.  Genitourinary:  Negative for dysuria, frequency, genital sores, vaginal bleeding and vaginal discharge.  Musculoskeletal:  Positive for back pain. Negative for arthralgias, gait problem and joint swelling.  Skin:  Negative for color change and rash.  Neurological:  Negative for dizziness, tremors, light-headedness and headaches.  Hematological:  Negative for adenopathy. Does not bruise/bleed easily.  Psychiatric/Behavioral:  Negative for dysphoric mood and sleep disturbance. The patient is not nervous/anxious.     Patient Active Problem List   Diagnosis Date Noted   Menopause syndrome 09/11/2023   Lumbosacral facet arthropathy (Multilevel) (Bilateral) 03/24/2023   Lumbosacral facet syndrome (Left) 03/24/2023   Lumbar facet joint pain 03/24/2023   Chronic low back pain (1ry area of Pain) (Bilateral) w/o  sciatica 03/24/2023   Vitamin D deficiency 03/24/2023   Abnormal drug screen (03/10/2023) 03/13/2023   Marijuana use 03/13/2023   Chronic pain syndrome 03/10/2023   Pharmacologic therapy 03/10/2023   Disorder of skeletal system 03/10/2023   Problems influencing health status 03/10/2023   Chronic lower extremity pain (2ry area of Pain) (Left) 03/10/2023   Chronic hip pain (3ry area of Pain) (Left) 03/10/2023   Hyperlipidemia, mixed 02/10/2023   BMI 36.0-36.9,adult 02/10/2023   Arthralgia of hip (Left) 10/03/2022    Chronic idiopathic constipation 09/30/2022   Greater trochanteric pain syndrome (Left) 08/22/2022   Chronic low back pain (1ry area of Pain) (Bilateral) (L>R) w/ sciatica (Left) 08/22/2022   Arthralgia of knee (Left) 08/22/2022   Peroneal tendinitis of left lower leg 08/22/2022   Abnormal mammogram of right breast 08/05/2022   Cervical radiculopathy 03/07/2021    No Known Allergies  Past Surgical History:  Procedure Laterality Date   BREAST BIOPSY Right 10/04/2021   u/s bx, 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  Current Meds  Medication Sig   diclofenac (VOLTAREN) 75 MG EC tablet Take 1 tablet (75 mg total) by mouth 2 (two) times daily as needed.   estradiol (ESTRACE) 0.5 MG tablet Take 1 tablet (0.5 mg total) by mouth daily.   medroxyPROGESTERone (PROVERA) 2.5 MG tablet Take 1 tablet (2.5 mg total) by mouth daily.   NON FORMULARY CBD gummies at bedtime       09/11/2023    9:32 AM 07/14/2023    8:45 AM 02/14/2023    1:55 PM 02/10/2023    1:20 PM  GAD 7 : Generalized Anxiety Score  Nervous, Anxious, on Edge 0 0 0 0  Control/stop worrying 0 0 0 0  Worry too much - different things 0 0 0 0  Trouble relaxing 0 0 0 0  Restless 0 0 0 0  Easily annoyed or irritable 3 0 0 0  Afraid - awful might happen 0 0 0 0  Total GAD 7 Score 3 0 0 0  Anxiety Difficulty Not difficult at all Not difficult at all Not difficult at all Not difficult at all        09/11/2023    9:32 AM 07/14/2023    8:45 AM 03/24/2023   12:46 PM  Depression screen PHQ 2/9  Decreased Interest 0 0 0  Down, Depressed, Hopeless 0 0 0  PHQ - 2 Score 0 0 0  Altered sleeping 3 3   Tired, decreased energy 3 3   Change in appetite 0 0   Feeling bad or failure about yourself  0 0   Trouble concentrating 0 0   Moving slowly or fidgety/restless 0 0   Suicidal thoughts 0 0   PHQ-9 Score 6 6   Difficult doing work/chores Not difficult at all Not difficult at all     BP Readings from Last  3 Encounters:  09/11/23 128/76  07/14/23 126/80  04/18/23 (!) 130/92    Physical Exam Vitals and nursing note reviewed.  Constitutional:      General: She is not in acute distress.    Appearance: She is well-developed.  HENT:     Head: Normocephalic and atraumatic.     Right Ear: Tympanic membrane and ear canal normal.     Left Ear: Tympanic membrane and ear canal normal.     Nose:     Right Sinus: No maxillary sinus tenderness.     Left Sinus: No maxillary sinus tenderness.  Eyes:     General: No scleral icterus.       Right eye: No discharge.        Left eye: No discharge.     Conjunctiva/sclera: Conjunctivae normal.  Neck:     Thyroid: No thyromegaly.     Vascular: No carotid bruit.  Cardiovascular:     Rate and Rhythm: Normal rate and regular rhythm.     Pulses: Normal pulses.     Heart sounds: Normal heart sounds.  Pulmonary:     Effort: Pulmonary effort is normal. No respiratory distress.     Breath sounds: No wheezing.  Abdominal:     General: Bowel sounds are normal.     Palpations: Abdomen is soft.     Tenderness: There is no abdominal tenderness.  Musculoskeletal:     Cervical back: Normal range of motion. No erythema.     Right lower leg: No edema.     Left lower leg: No edema.  Lymphadenopathy:     Cervical: No cervical adenopathy.  Skin:    General: Skin is warm and dry.     Findings: No rash.  Neurological:     Mental Status: She is alert  and oriented to person, place, and time.     Cranial Nerves: No cranial nerve deficit.     Sensory: No sensory deficit.     Deep Tendon Reflexes: Reflexes are normal and symmetric.  Psychiatric:        Attention and Perception: Attention normal.        Mood and Affect: Mood normal.     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  Has taken OCPs in the past without side effects. Will begin daily HRT .      Relevant Medications   estradiol (ESTRACE) 0.5 MG tablet   medroxyPROGESTERone (PROVERA) 2.5 MG tablet   Hyperlipidemia, mixed    Lipids managed with diet changes Lab Results  Component Value Date   LDLCALC 120 (H) 02/10/2023         Relevant Orders   Lipid panel   Other Visit Diagnoses     Annual physical exam    -  Primary   Relevant Orders   CBC with Differential/Platelet   Comprehensive metabolic panel   Lipid panel  TSH   Encounter for screening mammogram for breast cancer       mammogram up to date   Colon cancer screening       Relevant Orders   Ambulatory referral to Gastroenterology       No follow-ups on file.    Reubin Milan, MD [facility] [place]

## 2023-09-12 LAB — CBC WITH DIFFERENTIAL/PLATELET
Basophils Absolute: 0.1 10*3/uL (ref 0.0–0.2)
Basos: 1 %
EOS (ABSOLUTE): 0.1 10*3/uL (ref 0.0–0.4)
Eos: 2 %
Hematocrit: 43.3 % (ref 34.0–46.6)
Hemoglobin: 13.7 g/dL (ref 11.1–15.9)
Immature Grans (Abs): 0 10*3/uL (ref 0.0–0.1)
Immature Granulocytes: 0 %
Lymphocytes Absolute: 1.9 10*3/uL (ref 0.7–3.1)
Lymphs: 28 %
MCH: 28.7 pg (ref 26.6–33.0)
MCHC: 31.6 g/dL (ref 31.5–35.7)
MCV: 91 fL (ref 79–97)
Monocytes Absolute: 0.6 10*3/uL (ref 0.1–0.9)
Monocytes: 9 %
Neutrophils Absolute: 4.2 10*3/uL (ref 1.4–7.0)
Neutrophils: 60 %
Platelets: 344 10*3/uL (ref 150–450)
RBC: 4.78 x10E6/uL (ref 3.77–5.28)
RDW: 12.6 % (ref 11.7–15.4)
WBC: 6.9 10*3/uL (ref 3.4–10.8)

## 2023-09-12 LAB — COMPREHENSIVE METABOLIC PANEL
ALT: 36 [IU]/L — ABNORMAL HIGH (ref 0–32)
AST: 26 [IU]/L (ref 0–40)
Albumin: 4.6 g/dL (ref 3.8–4.9)
Alkaline Phosphatase: 101 [IU]/L (ref 44–121)
BUN/Creatinine Ratio: 32 — ABNORMAL HIGH (ref 9–23)
BUN: 24 mg/dL (ref 6–24)
Bilirubin Total: 0.3 mg/dL (ref 0.0–1.2)
CO2: 24 mmol/L (ref 20–29)
Calcium: 9.4 mg/dL (ref 8.7–10.2)
Chloride: 104 mmol/L (ref 96–106)
Creatinine, Ser: 0.76 mg/dL (ref 0.57–1.00)
Globulin, Total: 2.5 g/dL (ref 1.5–4.5)
Glucose: 112 mg/dL — ABNORMAL HIGH (ref 70–99)
Potassium: 4.4 mmol/L (ref 3.5–5.2)
Sodium: 142 mmol/L (ref 134–144)
Total Protein: 7.1 g/dL (ref 6.0–8.5)
eGFR: 94 mL/min/{1.73_m2} (ref >59)

## 2023-09-12 LAB — TSH: TSH: 1.71 u[IU]/mL (ref 0.450–4.500)

## 2023-09-12 LAB — SPECIMEN STATUS REPORT

## 2023-09-12 LAB — LIPID PANEL
Chol/HDL Ratio: 4.2 {ratio} (ref 0.0–4.4)
Cholesterol, Total: 221 mg/dL — ABNORMAL HIGH (ref 100–199)
HDL: 53 mg/dL (ref >39)
LDL Chol Calc (NIH): 146 mg/dL — ABNORMAL HIGH (ref 0–99)
Triglycerides: 124 mg/dL (ref 0–149)
VLDL Cholesterol Cal: 22 mg/dL (ref 5–40)

## 2023-09-17 ENCOUNTER — Telehealth: Payer: Self-pay

## 2023-09-17 NOTE — Telephone Encounter (Signed)
Pt left vm she was returning your call to schedule her colonoscopy.

## 2024-01-20 ENCOUNTER — Encounter: Payer: Self-pay | Admitting: Internal Medicine

## 2024-01-22 ENCOUNTER — Other Ambulatory Visit: Payer: Self-pay | Admitting: Internal Medicine

## 2024-01-22 DIAGNOSIS — M25552 Pain in left hip: Secondary | ICD-10-CM

## 2024-01-22 MED ORDER — CELECOXIB 100 MG PO CAPS
100.0000 mg | ORAL_CAPSULE | Freq: Two times a day (BID) | ORAL | 0 refills | Status: DC
Start: 2024-01-22 — End: 2024-04-19

## 2024-01-22 NOTE — Progress Notes (Unsigned)
 Date:  01/22/2024   Name:  Jessica Robles   DOB:  [birth year]   MRN:  [id number]   Chief Complaint: No chief complaint on file.  HPI  Review of Systems   Lab Results  Component Value Date   NA 142 09/11/2023   K 4.4 09/11/2023   CO2 24 09/11/2023   GLUCOSE 112 (H) 09/11/2023   BUN 24 09/11/2023   CREATININE 0.76 09/11/2023   CALCIUM 9.4 09/11/2023   EGFR 94 09/11/2023   Lab Results  Component Value Date   CHOL 221 (H) 09/11/2023   HDL 53 09/11/2023   LDLCALC 146 (H) 09/11/2023   TRIG 124 09/11/2023   CHOLHDL 4.2 09/11/2023   Lab Results  Component Value Date   TSH 1.710 09/11/2023   Lab Results  Component Value Date   HGBA1C 5.4 02/10/2023   Lab Results  Component Value Date   WBC 6.9 09/11/2023   HGB 13.7 09/11/2023   HCT 43.3 09/11/2023   MCV 91 09/11/2023   PLT 344 09/11/2023   Lab Results  Component Value Date   ALT 36 (H) 09/11/2023   AST 26 09/11/2023   ALKPHOS 101 09/11/2023   BILITOT 0.3 09/11/2023   Lab Results  Component Value Date   25OHVITD2 <1.0 03/10/2023   25OHVITD3 19 03/10/2023   VD25OH 46.9 07/14/2023     Patient Active Problem List   Diagnosis Date Noted   Menopause syndrome 09/11/2023   Lumbosacral facet arthropathy (Multilevel) (Bilateral) 03/24/2023   Lumbosacral facet syndrome (Left) 03/24/2023   Lumbar facet joint pain 03/24/2023   Chronic low back pain (1ry area of Pain) (Bilateral) w/o sciatica 03/24/2023   Vitamin D  deficiency 03/24/2023   Abnormal drug screen (03/10/2023) 03/13/2023   Marijuana use 03/13/2023   Chronic pain syndrome 03/10/2023   Pharmacologic therapy 03/10/2023   Disorder of skeletal system 03/10/2023   Problems influencing health status 03/10/2023   Chronic lower extremity pain (2ry area of Pain) (Left) 03/10/2023   Chronic hip pain (3ry area of Pain) (Left) 03/10/2023   Hyperlipidemia, mixed 02/10/2023   BMI 36.0-36.9,adult 02/10/2023   Arthralgia of hip (Left) 10/03/2022   Chronic idiopathic  constipation 09/30/2022   Greater trochanteric pain syndrome (Left) 08/22/2022   Chronic low back pain (1ry area of Pain) (Bilateral) (L>R) w/ sciatica (Left) 08/22/2022   Arthralgia of knee (Left) 08/22/2022   Peroneal tendinitis of left lower leg 08/22/2022   Abnormal mammogram of right breast 08/05/2022   Cervical radiculopathy 03/07/2021    No Known Allergies  Past Surgical History:  Procedure Laterality Date   BREAST BIOPSY Right 10/04/2021   u/s bx, 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  No outpatient medications have been marked as taking for the 01/22/24 encounter (Orders Only) with Jessica Jessica DEL, MD.       09/11/2023    9:32 AM 07/14/2023  8:45 AM 02/14/2023    1:55 PM 02/10/2023    1:20 PM  GAD 7 : Generalized Anxiety Score  Nervous, Anxious, on Edge 0 0 0 0  Control/stop worrying 0 0 0 0  Worry too much - different things 0 0 0 0  Trouble relaxing 0 0 0 0  Restless 0 0 0 0  Easily annoyed or irritable 3 0 0 0  Afraid - awful might happen 0 0 0 0  Total GAD 7 Score 3 0 0 0  Anxiety Difficulty Not difficult at all Not difficult at all Not difficult at all Not difficult at all       09/11/2023    9:32 AM 07/14/2023    8:45 AM 03/24/2023   12:46 PM  Depression screen PHQ 2/9  Decreased Interest 0 0 0  Down, Depressed, Hopeless 0 0 0  PHQ - 2 Score 0 0 0  Altered sleeping 3 3   Tired, decreased energy 3 3   Change in  appetite 0 0   Feeling bad or failure about yourself  0 0   Trouble concentrating 0 0   Moving slowly or fidgety/restless 0 0   Suicidal thoughts 0 0   PHQ-9 Score 6 6   Difficult doing work/chores Not difficult at all Not difficult at all     BP Readings from Last 3 Encounters:  09/11/23 128/76  07/14/23 126/80  04/18/23 (!) 130/92    Physical Exam  Wt Readings from Last 3 Encounters:  09/11/23 222 lb (100.7 kg)  07/14/23 223 lb (101.2 kg)  04/18/23 217 lb (98.4 kg)    There were no vitals taken for this visit.  Assessment and Plan:  Problem List Items Addressed This Visit   None   No follow-ups on file.    Jessica HILARIO Adie, MD [facility] [place]

## 2024-01-26 ENCOUNTER — Ambulatory Visit: Payer: BC Managed Care – PPO | Admitting: Family Medicine

## 2024-01-26 ENCOUNTER — Encounter: Payer: Self-pay | Admitting: Family Medicine

## 2024-01-26 ENCOUNTER — Ambulatory Visit: Payer: Self-pay

## 2024-01-26 VITALS — BP 112/78 | HR 89 | Ht 66.0 in | Wt 211.0 lb

## 2024-01-26 DIAGNOSIS — J0100 Acute maxillary sinusitis, unspecified: Secondary | ICD-10-CM | POA: Diagnosis not present

## 2024-01-26 DIAGNOSIS — R051 Acute cough: Secondary | ICD-10-CM | POA: Diagnosis not present

## 2024-01-26 LAB — POCT INFLUENZA A/B
Influenza A, POC: NEGATIVE
Influenza B, POC: NEGATIVE

## 2024-01-26 LAB — POC COVID19 BINAXNOW: SARS Coronavirus 2 Ag: NEGATIVE

## 2024-01-26 MED ORDER — AMOXICILLIN-POT CLAVULANATE 875-125 MG PO TABS
1.0000 | ORAL_TABLET | Freq: Two times a day (BID) | ORAL | 0 refills | Status: DC
Start: 2024-01-26 — End: 2024-04-19

## 2024-01-26 NOTE — Telephone Encounter (Signed)
 Summary: headache, mild ear pain , congestion.   Pt called in with, headache, mild ear pain , congestion. No appt with Dr Gala Jubilee until March         Chief Complaint: Cough,sinus congestion, ear pain. Symptoms: Above Frequency: Last week Pertinent Negatives: Patient denies ever Disposition: [] ED /[] Urgent Care (no appt availability in office) / [x] Appointment(In office/virtual)/ []  [facility]/ [] Home Care/ [] Refused Recommended Disposition /[] [place]/ []  Follow-up with PCP Additional Notes: Agrees with appointment.  Reason for Disposition  Earache  Answer Assessment - Initial Assessment Questions 1. LOCATION: "Where does it hurt?"      Headache 2. ONSET: "When did the sinus pain start?"  (e.g., hours, days)      Last Wednesday 3. SEVERITY: "How bad is the pain?"   (Scale 1-10; mild, moderate or severe)   - MILD (1-3): doesn't interfere with normal activities    - MODERATE (4-7): interferes with normal activities (e.g., work or school) or awakens from sleep   - SEVERE (8-10): excruciating pain and patient unable to do any normal activities        7 4. RECURRENT SYMPTOM: "Have you ever had sinus problems before?" If Yes, ask: "When was the last time?" and "What happened that time?"      Yes 5. NASAL CONGESTION: "Is the nose blocked?" If Yes, ask: "Can you open it or must you breathe through your mouth?"     No 6. NASAL DISCHARGE: "Do you have discharge from your nose?" If so ask, "What color?"     green 7. FEVER: "Do you have a fever?" If Yes, ask: "What is it, how was it measured, and when did it start?"      No 8. OTHER SYMPTOMS: "Do you have any other symptoms?" (e.g., sore throat, cough, earache, difficulty breathing)     Ear pain 9. PREGNANCY: "Is there any chance you are pregnant?" "When was your last menstrual period?"     No  Protocols used: Sinus Pain or Congestion-A-AH

## 2024-01-26 NOTE — Progress Notes (Signed)
 Date:  01/26/2024   Name:  Jessica Robles   DOB:  07-Jun-1971   MRN:  [id number]   Chief Complaint: Cough (Patient presents today for cough and congestion x 1 day. She did have a low grade fever last week. She got a little better but began to feel sick last night. )  Cough This is a new problem. The current episode started in the past 7 days. The problem has been waxing and waning. The problem occurs hourly. The cough is Productive of purulent sputum and productive of blood-tinged sputum. Associated symptoms include chills, ear pain, a fever, headaches, nasal congestion, postnasal drip, rhinorrhea and a sore throat. Pertinent negatives include no chest pain, shortness of breath or wheezing. She has tried OTC cough suppressant for the symptoms. The treatment provided no relief.  Sinusitis This is a new problem. The current episode started in the past 7 days. The problem has been waxing and waning since onset. Associated symptoms include chills, congestion, coughing, ear pain, headaches, sinus pressure and a sore throat. Pertinent negatives include no shortness of breath. Past treatments include oral decongestants.    Lab Results  Component Value Date   NA 142 09/11/2023   K 4.4 09/11/2023   CO2 24 09/11/2023   GLUCOSE 112 (H) 09/11/2023   BUN 24 09/11/2023   CREATININE 0.76 09/11/2023   CALCIUM 9.4 09/11/2023   EGFR 94 09/11/2023   Lab Results  Component Value Date   CHOL 221 (H) 09/11/2023   HDL 53 09/11/2023   LDLCALC 146 (H) 09/11/2023   TRIG 124 09/11/2023   CHOLHDL 4.2 09/11/2023   Lab Results  Component Value Date   TSH 1.710 09/11/2023   Lab Results  Component Value Date   HGBA1C 5.4 02/10/2023   Lab Results  Component Value Date   WBC 6.9 09/11/2023   HGB 13.7 09/11/2023   HCT 43.3 09/11/2023   MCV 91 09/11/2023   PLT 344 09/11/2023   Lab Results  Component Value Date   ALT 36 (H) 09/11/2023   AST 26 09/11/2023   ALKPHOS 101 09/11/2023   BILITOT 0.3  09/11/2023   Lab Results  Component Value Date   25OHVITD2 <1.0 03/10/2023   25OHVITD3 19 03/10/2023   VD25OH 46.9 07/14/2023     Review of Systems  Constitutional:  Positive for chills and fever.  HENT:  Positive for congestion, ear pain, postnasal drip, rhinorrhea, sinus pressure, sinus pain and sore throat. Negative for nosebleeds.   Eyes:  Negative for visual disturbance.  Respiratory:  Positive for cough. Negative for chest tightness, shortness of breath and wheezing.   Cardiovascular:  Negative for chest pain, palpitations and leg swelling.  Neurological:  Positive for headaches.    Patient Active Problem List   Diagnosis Date Noted   Menopause syndrome 09/11/2023   Lumbosacral facet arthropathy (Multilevel) (Bilateral) 03/24/2023   Lumbosacral facet syndrome (Left) 03/24/2023   Lumbar facet joint pain 03/24/2023   Chronic low back pain (1ry area of Pain) (Bilateral) w/o sciatica 03/24/2023   Vitamin D  deficiency 03/24/2023   Abnormal drug screen (03/10/2023) 03/13/2023   Marijuana use 03/13/2023   Chronic pain syndrome 03/10/2023   Pharmacologic therapy 03/10/2023   Disorder of skeletal system 03/10/2023   Problems influencing health status 03/10/2023   Chronic lower extremity pain (2ry area of Pain) (Left) 03/10/2023   Chronic hip pain (3ry area of Pain) (Left) 03/10/2023   Hyperlipidemia, mixed 02/10/2023   BMI 36.0-36.9,adult 02/10/2023   Arthralgia of  hip (Left) 10/03/2022   Chronic idiopathic constipation 09/30/2022   Greater trochanteric pain syndrome (Left) 08/22/2022   Chronic low back pain (1ry area of Pain) (Bilateral) (L>R) w/ sciatica (Left) 08/22/2022   Arthralgia of knee (Left) 08/22/2022   Peroneal tendinitis of left lower leg 08/22/2022   Abnormal mammogram of right breast 08/05/2022   Cervical radiculopathy 03/07/2021    No Known Allergies  Past Surgical History:  Procedure Laterality Date   BREAST BIOPSY Right 10/04/2021   u/s bx, 9:00  "heart" clip-fibroadenoma   COLONOSCOPY N/A 08/17/2021   Procedure: COLONOSCOPY;  Surgeon: Luke Salaam, MD;  Location: [facility] ENDOSCOPY;  Service: Gastroenterology;  Laterality: N/A;    Social History   Tobacco Use   Smoking status: Every Day    Types: E-cigarettes   Smokeless tobacco: Never   Tobacco comments:    Qit smoking tobacco 2022  Vaping Use   Vaping status: Every Day   Start date: 09/15/2021   Substances: Nicotine, Flavoring  Substance Use Topics   Alcohol use: Yes    Comment: occasional   Drug use: Never     Medication list has been reviewed and updated.  Current Meds  Medication Sig   estradiol  (ESTRACE ) 0.5 MG tablet Take 1 tablet (0.5 mg total) by mouth daily.   medroxyPROGESTERone  (PROVERA ) 2.5 MG tablet Take 1 tablet (2.5 mg total) by mouth daily.       09/11/2023    9:32 AM 07/14/2023    8:45 AM 02/14/2023    1:55 PM 02/10/2023    1:20 PM  GAD 7 : Generalized Anxiety Score  Nervous, Anxious, on Edge 0 0 0 0  Control/stop worrying 0 0 0 0  Worry too much - different things 0 0 0 0  Trouble relaxing 0 0 0 0  Restless 0 0 0 0  Easily annoyed or irritable 3 0 0 0  Afraid - awful might happen 0 0 0 0  Total GAD 7 Score 3 0 0 0  Anxiety Difficulty Not difficult at all Not difficult at all Not difficult at all Not difficult at all       09/11/2023    9:32 AM 07/14/2023    8:45 AM 03/24/2023   12:46 PM  Depression screen PHQ 2/9  Decreased Interest 0 0 0  Down, Depressed, Hopeless 0 0 0  PHQ - 2 Score 0 0 0  Altered sleeping 3 3   Tired, decreased energy 3 3   Change in appetite 0 0   Feeling bad or failure about yourself  0 0   Trouble concentrating 0 0   Moving slowly or fidgety/restless 0 0   Suicidal thoughts 0 0   PHQ-9 Score 6 6   Difficult doing work/chores Not difficult at all Not difficult at all     BP Readings from Last 3 Encounters:  01/26/24 112/78  09/11/23 128/76  07/14/23 126/80    Physical Exam Vitals and nursing note  reviewed.  Constitutional:      General: She is not in acute distress.    Appearance: She is not diaphoretic.  HENT:     Head: Normocephalic and atraumatic.     Right Ear: Tympanic membrane, ear canal and external ear normal.     Left Ear: Tympanic membrane, ear canal and external ear normal.     Nose: No congestion.     Right Turbinates: Swollen.     Left Turbinates: Swollen.     Right Sinus: Maxillary sinus tenderness and  frontal sinus tenderness present.     Left Sinus: Maxillary sinus tenderness and frontal sinus tenderness present.     Mouth/Throat:     Mouth: Mucous membranes are moist.  Eyes:     General:        Right eye: No discharge.        Left eye: No discharge.     Conjunctiva/sclera: Conjunctivae normal.     Pupils: Pupils are equal, round, and reactive to light.  Neck:     Thyroid : No thyromegaly.     Vascular: No JVD.  Cardiovascular:     Rate and Rhythm: Normal rate and regular rhythm.     Heart sounds: Normal heart sounds. No murmur heard.    No friction rub. No gallop.  Pulmonary:     Effort: Pulmonary effort is normal.     Breath sounds: Normal breath sounds. No wheezing, rhonchi or rales.  Abdominal:     General: Bowel sounds are normal.     Palpations: Abdomen is soft. There is no mass.     Tenderness: There is no abdominal tenderness. There is no guarding.  Musculoskeletal:        General: Normal range of motion.     Cervical back: Normal range of motion and neck supple.  Lymphadenopathy:     Cervical: No cervical adenopathy.  Skin:    General: Skin is warm and dry.  Neurological:     Mental Status: She is alert.     Deep Tendon Reflexes: Reflexes are normal and symmetric.    Wt Readings from Last 3 Encounters:  01/26/24 211 lb (95.7 kg)  09/11/23 222 lb (100.7 kg)  07/14/23 223 lb (101.2 kg)    BP 112/78   Pulse 89   Ht 5\' 6"  (1.676 m)   Wt 211 lb (95.7 kg)   SpO2 96%   BMI 34.06 kg/m   Assessment and Plan: 1. Acute cough  (Primary) New onset.  Persistent.  Stable but coughing up some blood-tinged sputum.  This initially began as fever last week and progressed to episodic coughing.  COVID and influenza AMB were done and they were both noted to be negative. - POC COVID-19 BinaxNow - POCT Influenza A/B  2. Acute maxillary sinusitis, recurrence not specified Acute.  Persistent.  Stable.  Symptomatology of bloody nasal discharge as well as tenderness over the maxillary and lesser so the frontal sinuses indicate likely sinus infection and we will treat with Augmentin  875 mg twice a day for 10 days. - amoxicillin -clavulanate (AUGMENTIN ) 875-125 MG tablet; Take 1 tablet by mouth 2 (two) times daily.  Dispense: 20 tablet; Refill: 0     Alayne Allis, MD

## 2024-04-19 ENCOUNTER — Other Ambulatory Visit: Payer: Self-pay | Admitting: Internal Medicine

## 2024-04-19 DIAGNOSIS — M25552 Pain in left hip: Secondary | ICD-10-CM

## 2024-04-19 MED ORDER — CELECOXIB 100 MG PO CAPS
100.0000 mg | ORAL_CAPSULE | Freq: Two times a day (BID) | ORAL | 0 refills | Status: DC
Start: 2024-04-19 — End: 2024-08-05

## 2024-04-19 NOTE — Telephone Encounter (Signed)
 Please review

## 2024-04-19 NOTE — Progress Notes (Unsigned)
 Date:  04/19/2024   Name:  Jessica Robles   DOB:  1971/11/27   MRN:  [id number]   Chief Complaint: No chief complaint on file.  HPI  Review of Systems   Lab Results  Component Value Date   NA 142 09/11/2023   K 4.4 09/11/2023   CO2 24 09/11/2023   GLUCOSE 112 (H) 09/11/2023   BUN 24 09/11/2023   CREATININE 0.76 09/11/2023   CALCIUM 9.4 09/11/2023   EGFR 94 09/11/2023   Lab Results  Component Value Date   CHOL 221 (H) 09/11/2023   HDL 53 09/11/2023   LDLCALC 146 (H) 09/11/2023   TRIG 124 09/11/2023   CHOLHDL 4.2 09/11/2023   Lab Results  Component Value Date   TSH 1.710 09/11/2023   Lab Results  Component Value Date   HGBA1C 5.4 02/10/2023   Lab Results  Component Value Date   WBC 6.9 09/11/2023   HGB 13.7 09/11/2023   HCT 43.3 09/11/2023   MCV 91 09/11/2023   PLT 344 09/11/2023   Lab Results  Component Value Date   ALT 36 (H) 09/11/2023   AST 26 09/11/2023   ALKPHOS 101 09/11/2023   BILITOT 0.3 09/11/2023   Lab Results  Component Value Date   25OHVITD2 <1.0 03/10/2023   25OHVITD3 19 03/10/2023   VD25OH 46.9 07/14/2023     Patient Active Problem List   Diagnosis Date Noted   Menopause syndrome 09/11/2023   Lumbosacral facet arthropathy (Multilevel) (Bilateral) 03/24/2023   Lumbosacral facet syndrome (Left) 03/24/2023   Lumbar facet joint pain 03/24/2023   Chronic low back pain (1ry area of Pain) (Bilateral) w/o sciatica 03/24/2023   Vitamin D  deficiency 03/24/2023   Abnormal drug screen (03/10/2023) 03/13/2023   Marijuana use 03/13/2023   Chronic pain syndrome 03/10/2023   Pharmacologic therapy 03/10/2023   Disorder of skeletal system 03/10/2023   Problems influencing health status 03/10/2023   Chronic lower extremity pain (2ry area of Pain) (Left) 03/10/2023   Chronic hip pain (3ry area of Pain) (Left) 03/10/2023   Hyperlipidemia, mixed 02/10/2023   BMI 36.0-36.9,adult 02/10/2023   Arthralgia of hip (Left) 10/03/2022   Chronic idiopathic  constipation 09/30/2022   Greater trochanteric pain syndrome (Left) 08/22/2022   Chronic low back pain (1ry area of Pain) (Bilateral) (L>R) w/ sciatica (Left) 08/22/2022   Arthralgia of knee (Left) 08/22/2022   Peroneal tendinitis of left lower leg 08/22/2022   Abnormal mammogram of right breast 08/05/2022   Cervical radiculopathy 03/07/2021    No Known Allergies  Past Surgical History:  Procedure Laterality Date   BREAST BIOPSY Right 10/04/2021   u/s bx, 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  No outpatient medications have been marked as taking for the 04/19/24 encounter (Orders Only) with Sheron Dixons, MD.       09/11/2023    9:32 AM 07/14/2023  8:45 AM 02/14/2023    1:55 PM 02/10/2023    1:20 PM  GAD 7 : Generalized Anxiety Score  Nervous, Anxious, on Edge 0 0 0 0  Control/stop worrying 0 0 0 0  Worry too much - different things 0 0 0 0  Trouble relaxing 0 0 0 0  Restless 0 0 0 0  Easily annoyed or irritable 3 0 0 0  Afraid - awful might happen 0 0 0 0  Total GAD 7 Score 3 0 0 0  Anxiety Difficulty Not difficult at all Not difficult at all Not difficult at all Not difficult at all       09/11/2023    9:32 AM 07/14/2023    8:45 AM 03/24/2023   12:46 PM  Depression screen PHQ 2/9  Decreased Interest 0 0 0  Down, Depressed, Hopeless 0 0 0  PHQ - 2 Score 0 0 0  Altered sleeping 3 3   Tired, decreased energy 3 3   Change in  appetite 0 0   Feeling bad or failure about yourself  0 0   Trouble concentrating 0 0   Moving slowly or fidgety/restless 0 0   Suicidal thoughts 0 0   PHQ-9 Score 6 6   Difficult doing work/chores Not difficult at all Not difficult at all     BP Readings from Last 3 Encounters:  01/26/24 112/78  09/11/23 128/76  07/14/23 126/80    Physical Exam  Wt Readings from Last 3 Encounters:  01/26/24 211 lb (95.7 kg)  09/11/23 222 lb (100.7 kg)  07/14/23 223 lb (101.2 kg)    There were no vitals taken for this visit.  Assessment and Plan:  Problem List Items Addressed This Visit   None   No follow-ups on file.    Sheron Dixons, MD [facility] [place]

## 2024-05-17 ENCOUNTER — Telehealth: Payer: Self-pay | Admitting: *Deleted

## 2024-05-17 ENCOUNTER — Other Ambulatory Visit: Payer: Self-pay | Admitting: *Deleted

## 2024-05-17 DIAGNOSIS — Z860100 Personal history of colon polyps, unspecified: Secondary | ICD-10-CM

## 2024-05-17 NOTE — Telephone Encounter (Signed)
 Gastroenterology Pre-Procedure Review  Request Date: 08/30/2024 Requesting Physician: Dr. Ole Berkeley  PATIENT REVIEW QUESTIONS: The patient responded to the following health history questions as indicated:    1. Are you having any GI issues? no 2. Do you have a personal history of Polyps? yes (last colonoscopy was 08/17/2021 with Dr Antony Baumgartner) 3. Do you have a family history of Colon Cancer or Polyps? yes (there is family history of colon polyps such as father, sister and grandmother) 4. Diabetes Mellitus? no 5. Joint replacements in the past 12 months?no 6. Major health problems in the past 3 months?no 7. Any artificial heart valves, MVP, or defibrillator?no    MEDICATIONS & ALLERGIES:    Patient reports the following regarding taking any anticoagulation/antiplatelet therapy:   Plavix, Coumadin, Eliquis, Xarelto, Lovenox, Pradaxa, Brilinta, or Effient? no Aspirin? no  Patient confirms/reports the following medications:  Current Outpatient Medications  Medication Sig Dispense Refill   celecoxib  (CELEBREX ) 100 MG capsule Take 1 capsule (100 mg total) by mouth 2 (two) times daily. 60 capsule 0   estradiol  (ESTRACE ) 0.5 MG tablet Take 1 tablet (0.5 mg total) by mouth daily. 90 tablet 1   medroxyPROGESTERone  (PROVERA ) 2.5 MG tablet Take 1 tablet (2.5 mg total) by mouth daily. 90 tablet 1   No current facility-administered medications for this visit.    Patient confirms/reports the following allergies:  No Known Allergies  No orders of the defined types were placed in this encounter.   AUTHORIZATION INFORMATION Primary Insurance: 1D#: Group #:  Secondary Insurance: 1D#: Group #:  SCHEDULE INFORMATION: Date: 08/30/2024 Time: Location: [facility]

## 2024-06-14 DIAGNOSIS — M5416 Radiculopathy, lumbar region: Secondary | ICD-10-CM | POA: Diagnosis not present

## 2024-08-03 ENCOUNTER — Other Ambulatory Visit: Payer: Self-pay | Admitting: Internal Medicine

## 2024-08-03 DIAGNOSIS — N951 Menopausal and female climacteric states: Secondary | ICD-10-CM

## 2024-08-03 DIAGNOSIS — M25552 Pain in left hip: Secondary | ICD-10-CM

## 2024-08-05 ENCOUNTER — Other Ambulatory Visit: Payer: Self-pay | Admitting: Internal Medicine

## 2024-08-05 DIAGNOSIS — M25552 Pain in left hip: Secondary | ICD-10-CM

## 2024-08-05 DIAGNOSIS — N951 Menopausal and female climacteric states: Secondary | ICD-10-CM

## 2024-08-05 NOTE — Telephone Encounter (Unsigned)
 Copied from CRM [id number]. Topic: Clinical - Medication Refill >> Aug 05, 2024 10:14 AM Carlatta H wrote: Medication: medroxyPROGESTERone  (PROVERA ) 2.5 MG tablet celecoxib  (CELEBREX ) 100 MG capsule estradiol  (ESTRACE ) 0.5 MG tablet  Has the patient contacted their pharmacy? Yes (Agent: If no, request that the patient contact the pharmacy for the refill. If patient does not wish to contact the pharmacy document the reason why and proceed with request.) (Agent: If yes, when and what did the pharmacy advise?) Advised to contact office  This is the patient's preferred pharmacy:  [facility] - [place], [place] - [address] [place] [address] Phone: [phone] Fax: [phone]    Is this the correct pharmacy for this prescription? Yes If no, delete pharmacy and type the correct one.   Has the prescription been filled recently? No  Is the patient out of the medication? Yes  Has the patient been seen for an appointment in the last year OR does the patient have an upcoming appointment? No  Can we respond through MyChart? Yes  Agent: Please be advised that Rx refills may take up to 3 business days. We ask that you follow-up with your pharmacy.

## 2024-08-05 NOTE — Telephone Encounter (Signed)
 Courtesy refill. Patient will need an office visit for additional refills.  Requested Prescriptions  Pending Prescriptions Disp Refills   celecoxib  (CELEBREX ) 100 MG capsule [Pharmacy Med Name: CELECOXIB  100 MG CAPSULE] 60 capsule 0    Sig: Take 1 capsule (100 mg total) by mouth 2 (two) times daily.     Analgesics:  COX2 Inhibitors Failed - 08/05/2024 12:10 PM      Failed - Manual Review: Labs are only required if the patient has taken medication for more than 8 weeks.      Failed - ALT in normal range and within 360 days    ALT  Date Value Ref Range Status  09/11/2023 36 (H) 0 - 32 IU/L Final         Failed - Valid encounter within last 12 months    Recent Outpatient Visits           6 months ago Acute cough   [place] [facility] at [facility], [place]              Passed - HGB in normal range and within 360 days    Hemoglobin  Date Value Ref Range Status  09/11/2023 13.7 11.1 - 15.9 g/dL Final         Passed - Cr in normal range and within 360 days    Creatinine, Ser  Date Value Ref Range Status  09/11/2023 0.76 0.57 - 1.00 mg/dL Final         Passed - HCT in normal range and within 360 days    Hematocrit  Date Value Ref Range Status  09/11/2023 43.3 34.0 - 46.6 % Final         Passed - AST in normal range and within 360 days    AST  Date Value Ref Range Status  09/11/2023 26 0 - 40 IU/L Final         Passed - eGFR is 30 or above and within 360 days    eGFR  Date Value Ref Range Status  09/11/2023 94 >59 mL/min/1.73 Final         Passed - Patient is not pregnant       estradiol  (ESTRACE ) 0.5 MG tablet [Pharmacy Med Name: ESTRADIOL  0.5 MG TABLET] 30 tablet 0    Sig: Take 1 tablet (0.5 mg total) by mouth daily.     OB/GYN:  Estrogens Failed - 08/05/2024 12:10 PM      Failed - Valid encounter within last 12 months    Recent Outpatient Visits           6 months ago Acute cough   [place] [facility] at [facility], MD              Passed - Mammogram is up-to-date per Health Maintenance      Passed - Last BP in normal range    BP Readings from Last 1 Encounters:  01/26/24 112/78          medroxyPROGESTERone  (PROVERA ) 2.5 MG tablet [Pharmacy Med Name: MEDROXYPROGESTERONE  2.5 MG TAB] 30 tablet 0    Sig: Take 1 tablet (2.5 mg total) by mouthdaily.     OB/GYN:  Progestins Failed - 08/05/2024 12:10 PM      Failed - Valid encounter within last 12 months    Recent Outpatient Visits           6 months ago Acute cough  [facility], Deanna C, MD              Failed - Patient is not a smoker      Passed - Last BP in normal range    BP Readings from Last 1 Encounters:  01/26/24 112/78

## 2024-08-06 ENCOUNTER — Ambulatory Visit
Admission: RE | Admit: 2024-08-06 | Discharge: 2024-08-06 | Disposition: A | Discharge status: Home / Self Care | Source: Ambulatory Visit | Attending: Emergency Medicine | Admitting: Emergency Medicine

## 2024-08-06 VITALS — BP 123/84 | HR 84 | Temp 98.5°F | Resp 15 | Ht 66.0 in | Wt 211.0 lb

## 2024-08-06 DIAGNOSIS — R509 Fever, unspecified: Secondary | ICD-10-CM | POA: Diagnosis not present

## 2024-08-06 DIAGNOSIS — E785 Hyperlipidemia, unspecified: Secondary | ICD-10-CM | POA: Insufficient documentation

## 2024-08-06 DIAGNOSIS — E559 Vitamin D deficiency, unspecified: Secondary | ICD-10-CM | POA: Insufficient documentation

## 2024-08-06 DIAGNOSIS — B349 Viral infection, unspecified: Secondary | ICD-10-CM | POA: Insufficient documentation

## 2024-08-06 DIAGNOSIS — R519 Headache, unspecified: Secondary | ICD-10-CM | POA: Diagnosis not present

## 2024-08-06 LAB — RESP PANEL BY RT-PCR (FLU A&B, COVID) ARPGX2
Influenza A by PCR: NEGATIVE
Influenza B by PCR: NEGATIVE
SARS Coronavirus 2 by RT PCR: NEGATIVE

## 2024-08-06 MED ORDER — ACETAMINOPHEN 325 MG PO TABS
975.0000 mg | ORAL_TABLET | Freq: Once | ORAL | Status: AC
  Administered 2024-08-06: 975 mg via ORAL

## 2024-08-06 NOTE — Telephone Encounter (Signed)
 Requested Prescriptions  Refused Prescriptions Disp Refills   medroxyPROGESTERone  (PROVERA ) 2.5 MG tablet 90 tablet 1    Sig: Take 1 tablet (2.5 mg total) by mouth daily.     OB/GYN:  Progestins Failed - 08/06/2024 12:01 PM      Failed - Valid encounter within last 12 months    Recent Outpatient Visits           6 months ago Acute cough   [place] [facility] at [facility], MD              Failed - Patient is not a smoker      Passed - Last BP in normal range    BP Readings from Last 1 Encounters:  08/06/24 123/84          celecoxib  (CELEBREX ) 100 MG capsule 60 capsule 0    Sig: Take 1 capsule (100 mg total) by mouth 2 (two) times daily.     Analgesics:  COX2 Inhibitors Failed - 08/06/2024 12:01 PM      Failed - Manual Review: Labs are only required if the patient has taken medication for more than 8 weeks.      Failed - ALT in normal range and within 360 days    ALT  Date Value Ref Range Status  09/11/2023 36 (H) 0 - 32 IU/L Final         Failed - Valid encounter within last 12 months    Recent Outpatient Visits           6 months ago Acute cough   [place] [facility] at [facility], MD              Passed - HGB in normal range and within 360 days    Hemoglobin  Date Value Ref Range Status  09/11/2023 13.7 11.1 - 15.9 g/dL Final         Passed - Cr in normal range and within 360 days    Creatinine, Ser  Date Value Ref Range Status  09/11/2023 0.76 0.57 - 1.00 mg/dL Final         Passed - HCT in normal range and within 360 days    Hematocrit  Date Value Ref Range Status  09/11/2023 43.3 34.0 - 46.6 % Final         Passed - AST in normal range and within 360 days    AST  Date Value Ref Range Status  09/11/2023 26 0 - 40 IU/L Final         Passed - eGFR is 30 or above and within 360 days    eGFR  Date Value Ref Range Status  09/11/2023 94 >59 mL/min/1.73  Final         Passed - Patient is not pregnant       estradiol  (ESTRACE ) 0.5 MG tablet 90 tablet 1    Sig: Take 1 tablet (0.5 mg total) by mouth daily.     OB/GYN:  Estrogens Failed - 08/06/2024 12:01 PM      Failed - Valid encounter within last 12 months    Recent Outpatient Visits           6 months ago Acute cough   [place] [facility] Cathryne JAYSON, MD              Passed - Mammogram is up-to-date  per Health Maintenance      Passed - Last BP in normal range    BP Readings from Last 1 Encounters:  08/06/24 123/84

## 2024-08-06 NOTE — ED Provider Notes (Signed)
 [facility]    CSN: [id number] Arrival date & time: 08/06/24  1141      History   Chief Complaint Chief Complaint  Patient presents with   Headache    Appointment    HPI Jessica Robles is a 53 y.o. female.   HPI  53 year old female with past medical history significant for multiple orthopedic issues, hyperlipidemia, and vitamin D  deficiency presents for evaluation of flulike symptoms that started yesterday.  She is reporting a low-grade temp of 99, ear pain, headache, and bodyaches.  She denies runny nose, nasal congestion, sore throat, or cough.  No known sick contacts.  She did recently drive to [place] .  History reviewed. No pertinent past medical history.  Patient Active Problem List   Diagnosis Date Noted   Menopause syndrome 09/11/2023   Lumbosacral facet arthropathy (Multilevel) (Bilateral) 03/24/2023   Lumbosacral facet syndrome (Left) 03/24/2023   Lumbar facet joint pain 03/24/2023   Chronic low back pain (1ry area of Pain) (Bilateral) w/o sciatica 03/24/2023   Vitamin D  deficiency 03/24/2023   Abnormal drug screen (03/10/2023) 03/13/2023   Marijuana use 03/13/2023   Chronic pain syndrome 03/10/2023   Pharmacologic therapy 03/10/2023   Disorder of skeletal system 03/10/2023   Problems influencing health status 03/10/2023   Chronic lower extremity pain (2ry area of Pain) (Left) 03/10/2023   Chronic hip pain (3ry area of Pain) (Left) 03/10/2023   Hyperlipidemia, mixed 02/10/2023   BMI 36.0-36.9,adult 02/10/2023   Arthralgia of hip (Left) 10/03/2022   Chronic idiopathic constipation 09/30/2022   Greater trochanteric pain syndrome (Left) 08/22/2022   Chronic low back pain (1ry area of Pain) (Bilateral) (L>R) w/ sciatica (Left) 08/22/2022   Arthralgia of knee (Left) 08/22/2022   Peroneal tendinitis of left lower leg 08/22/2022   Abnormal mammogram of right breast 08/05/2022   Cervical radiculopathy 03/07/2021    Past Surgical History:   Procedure Laterality Date   BREAST BIOPSY Right 10/04/2021   u/s bx, 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      Home Medications    Prior to Admission medications   Medication Sig Start Date End Date Taking? Authorizing Provider  celecoxib  (CELEBREX ) 100 MG capsule Take 1 capsule (100 mg total) by mouth 2 (two) times daily. 08/05/24   Justus Leita DEL, MD  estradiol  (ESTRACE ) 0.5 MG tablet Take 1 tablet (0.5 mg total) by mouth daily. 08/05/24   Justus Leita DEL, MD  medroxyPROGESTERone  (PROVERA ) 2.5 MG tablet Take 1 tablet (2.5 mg total) by mouthdaily. 08/05/24   Justus Leita DEL, MD    Family History Family History  Problem Relation Age of Onset   Cancer Mother        gall bladder with mets   Hypertension Father    Esophageal cancer Sister    Diabetes Maternal Grandmother    Heart disease Maternal Grandmother    Stroke Maternal Grandmother    Cancer Maternal Grandfather    Breast cancer Neg Hx     Social History Social History   Tobacco Use   Smoking status: Every Day    Types: E-cigarettes   Smokeless tobacco: Never   Tobacco comments:    Qit smoking tobacco 2022  Vaping Use   Vaping status: Every Day   Start date: 09/15/2021   Substances: Nicotine, Flavoring  Substance Use Topics   Alcohol use: Yes    Comment: occasional   Drug use: Never     Allergies   Patient has no known allergies.   Review of Systems Review of Systems  Constitutional:  Positive for fever.  HENT:  Positive for ear pain. Negative for congestion, rhinorrhea and sore throat.   Respiratory:  Negative for cough.   Musculoskeletal:  Positive for arthralgias and myalgias.  Neurological:  Positive for headaches.     Physical Exam Triage Vital Signs ED Triage Vitals  Encounter Vitals  Group     BP      Girls Systolic BP Percentile      Girls Diastolic BP Percentile      Boys Systolic BP Percentile      Boys Diastolic BP Percentile      Pulse      Resp      Temp      Temp src      SpO2      Weight      Height      Head Circumference      Peak Flow      Pain Score      Pain Loc      Pain Education      Exclude from Growth Chart    No data found.  Updated Vital Signs BP 123/84 (BP Location: Right Arm)   Pulse 84   Temp 98.5 F (36.9 C) (Oral)   Resp 15   Ht 5' 6 (1.676 m)   Wt 210 lb 15.7 oz (95.7 kg)   LMP 06/15/2021 (Exact Date)   SpO2 96%   BMI 34.05 kg/m   Visual Acuity Right Eye Distance:   Left Eye Distance:   Bilateral Distance:    Right Eye Near:   Left Eye Near:    Bilateral Near:     Physical Exam Vitals and nursing note reviewed.  Constitutional:      Appearance: Normal appearance. She is not ill-appearing.  HENT:     Head: Normocephalic and atraumatic.     Right Ear: Tympanic membrane, ear canal and external ear normal. There is no impacted cerumen.     Left Ear: Tympanic membrane, ear canal and external ear normal. There is no impacted cerumen.     Mouth/Throat:     Mouth: Mucous membranes are moist.     Pharynx: Oropharynx is clear. No oropharyngeal exudate or posterior oropharyngeal erythema.  Cardiovascular:     Rate and Rhythm: Normal rate and regular rhythm.     Pulses: Normal pulses.     Heart sounds: Normal heart sounds. No murmur heard.    No friction rub. No gallop.  Pulmonary:     Effort: Pulmonary effort is normal.     Breath sounds: Normal breath sounds. No wheezing, rhonchi or rales.  Musculoskeletal:     Cervical back: Normal range of motion and neck supple. No tenderness.  Lymphadenopathy:     Cervical: No cervical adenopathy.  Skin:    General: Skin is warm and dry.     Capillary Refill: Capillary refill takes less than 2 seconds.     Findings: No rash.  Neurological:     General: No focal deficit  present.     Mental Status: She is alert and oriented to person, place, and time.      UC Treatments / Results  Labs (all labs ordered are listed, but only abnormal results are displayed) Labs Reviewed  RESP  PANEL BY RT-PCR (FLU A&B, COVID) ARPGX2    EKG   Radiology No results found.  Procedures Procedures (including critical care time)  Medications Ordered in UC Medications  acetaminophen  (TYLENOL ) tablet 975 mg (975 mg Oral Given 08/06/24 1207)    Initial Impression / Assessment and Plan / UC Course  I have reviewed the triage vital signs and the nursing notes.  Pertinent labs & imaging results that were available during my care of the patient were reviewed by me and considered in my medical decision making (see chart for details).   Patient is a pleasant, nontoxic-appearing 53 year old female presenting for evaluation of low-grade fever with associated headache and bodyaches.  She is denying any upper or lower respiratory symptoms.  She denies any known sick contacts.  She did recently travel to [place]  but she drove and did not fly by air.  She is unaware of any sick contacts.  Her most prominent symptom is a headache and she reports using over-the-counter Advil cold and sinus, Excedrin Migraine, and Aleve without any improvement of symptoms.  She reports that the headache is keeping her up at night.  She last took a dose of anti-inflammatories at 745 this morning.  I will order her 75 mg of Tylenol  to help the patient with her headache along with a COVID and flu PCR.  Patient reports that she did take a home COVID test that was negative.  Respiratory panel was negative for COVID or influenza.  I will discharge patient with a diagnosis of viral illness.  I will have her continue to use Tylenol  and/or ibuprofen as needed for fever or pain.   Final Clinical Impressions(s) / UC Diagnoses   Final diagnoses:  Viral illness     Discharge Instructions      Your testing  today was negative for COVID or influenza.  Your physical exam was unremarkable.  I do feel that you have a viral infection which is causing your symptoms.  Continue to use over-the-counter Tylenol  and ibuprofen as needed for pain or if you develop a fever.  If you develop any new or worsening symptoms either return for reevaluation or follow-up with your primary care provider.     ED Prescriptions   None    I have reviewed the PDMP during this encounter.   Bernardino Ditch, NP 08/06/24 1247

## 2024-08-06 NOTE — Discharge Instructions (Addendum)
 Your testing today was negative for COVID or influenza.  Your physical exam was unremarkable.  I do feel that you have a viral infection which is causing your symptoms.  Continue to use over-the-counter Tylenol  and ibuprofen as needed for pain or if you develop a fever.  If you develop any new or worsening symptoms either return for reevaluation or follow-up with your primary care provider.

## 2024-08-06 NOTE — ED Triage Notes (Signed)
 Patient c/o headache that started yesterday.  Patient has taken Advil for her headache.  Patient has low grade fever.  Patient denies any other cold symptoms.

## 2024-08-10 ENCOUNTER — Other Ambulatory Visit: Payer: Self-pay

## 2024-08-10 ENCOUNTER — Encounter: Payer: Self-pay | Admitting: Internal Medicine

## 2024-08-10 ENCOUNTER — Ambulatory Visit: Admitting: Internal Medicine

## 2024-08-10 VITALS — BP 122/78 | HR 80 | Ht 66.0 in | Wt 213.0 lb

## 2024-08-10 DIAGNOSIS — H6993 Unspecified Eustachian tube disorder, bilateral: Secondary | ICD-10-CM

## 2024-08-10 DIAGNOSIS — R519 Headache, unspecified: Secondary | ICD-10-CM | POA: Diagnosis not present

## 2024-08-10 DIAGNOSIS — Z1231 Encounter for screening mammogram for malignant neoplasm of breast: Secondary | ICD-10-CM

## 2024-08-10 DIAGNOSIS — N951 Menopausal and female climacteric states: Secondary | ICD-10-CM | POA: Diagnosis not present

## 2024-08-10 MED ORDER — PREDNISONE 10 MG PO TABS
ORAL_TABLET | ORAL | 0 refills | Status: AC
Start: 2024-08-10 — End: 2024-08-16

## 2024-08-10 NOTE — Assessment & Plan Note (Signed)
 She is doing well on HRT - no hot flashes or sweats. She was out of medications for several weeks recently and symptoms returned. Will continue same dose for now - consider a trial of lower routine dosing in the future.

## 2024-08-10 NOTE — Patient Instructions (Signed)
 Use Flonase  nasal spray or Nasacort  nasal spray.

## 2024-08-10 NOTE — Progress Notes (Signed)
 Date:  08/10/2024   Name:  Jessica Robles   DOB:  [birth year]   MRN:  [id number]   Chief Complaint: Dizziness (Patient is having spinning when walking, and dull constant headache. X 6 days. ) and hormone replacement therapy  Dizziness This is a new problem. The current episode started in the past 7 days. Associated symptoms include congestion and headaches. Pertinent negatives include no chest pain, chills, fatigue or fever.    Review of Systems  Constitutional:  Negative for chills, fatigue and fever.  HENT:  Positive for congestion, hearing loss, sinus pressure and sinus pain. Negative for ear discharge.   Respiratory:  Negative for chest tightness and shortness of breath.   Cardiovascular:  Negative for chest pain and palpitations.  Neurological:  Positive for dizziness and headaches.  Psychiatric/Behavioral:  Negative for dysphoric mood and sleep disturbance. The patient is not nervous/anxious.      Lab Results  Component Value Date   NA 142 09/11/2023   K 4.4 09/11/2023   CO2 24 09/11/2023   GLUCOSE 112 (H) 09/11/2023   BUN 24 09/11/2023   CREATININE 0.76 09/11/2023   CALCIUM 9.4 09/11/2023   EGFR 94 09/11/2023   Lab Results  Component Value Date   CHOL 221 (H) 09/11/2023   HDL 53 09/11/2023   LDLCALC 146 (H) 09/11/2023   TRIG 124 09/11/2023   CHOLHDL 4.2 09/11/2023   Lab Results  Component Value Date   TSH 1.710 09/11/2023   Lab Results  Component Value Date   HGBA1C 5.4 02/10/2023   Lab Results  Component Value Date   WBC 6.9 09/11/2023   HGB 13.7 09/11/2023   HCT 43.3 09/11/2023   MCV 91 09/11/2023   PLT 344 09/11/2023   Lab Results  Component Value Date   ALT 36 (H) 09/11/2023   AST 26 09/11/2023   ALKPHOS 101 09/11/2023   BILITOT 0.3 09/11/2023   Lab Results  Component Value Date   25OHVITD2 <1.0 03/10/2023   25OHVITD3 19 03/10/2023   VD25OH 46.9 07/14/2023     Patient Active Problem List   Diagnosis Date Noted   Menopause syndrome  09/11/2023   Lumbosacral facet arthropathy (Multilevel) (Bilateral) 03/24/2023   Lumbosacral facet syndrome (Left) 03/24/2023   Lumbar facet joint pain 03/24/2023   Chronic low back pain (1ry area of Pain) (Bilateral) w/o sciatica 03/24/2023   Vitamin D  deficiency 03/24/2023   Abnormal drug screen (03/10/2023) 03/13/2023   Marijuana use 03/13/2023   Chronic pain syndrome 03/10/2023   Pharmacologic therapy 03/10/2023   Disorder of skeletal system 03/10/2023   Problems influencing health status 03/10/2023   Chronic lower extremity pain (2ry area of Pain) (Left) 03/10/2023   Chronic hip pain (3ry area of Pain) (Left) 03/10/2023   Hyperlipidemia, mixed 02/10/2023   BMI 36.0-36.9,adult 02/10/2023   Arthralgia of hip (Left) 10/03/2022   Chronic idiopathic constipation 09/30/2022   Greater trochanteric pain syndrome (Left) 08/22/2022   Chronic low back pain (1ry area of Pain) (Bilateral) (L>R) w/ sciatica (Left) 08/22/2022   Arthralgia of knee (Left) 08/22/2022   Peroneal tendinitis of left lower leg 08/22/2022   Abnormal mammogram of right breast 08/05/2022   Cervical radiculopathy 03/07/2021    No Known Allergies  Past Surgical History:  Procedure Laterality Date   BREAST BIOPSY Right 10/04/2021   u/s bx, 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  Current Meds  Medication Sig   celecoxib  (CELEBREX ) 100 MG capsule Take 1 capsule (100 mg total)  by mouth 2 (two) times daily.   estradiol  (ESTRACE ) 0.5 MG tablet Take 1 tablet (0.5 mg total) by mouth daily.   medroxyPROGESTERone  (PROVERA ) 2.5 MG tablet Take 1 tablet (2.5 mg total) by mouthdaily.   predniSONE  (DELTASONE ) 10 MG tablet Take 6 tablets (60 mg total) by mouth daily with breakfast for 1 day, THEN 5 tablets (50 mg total) daily with breakfast for 1 day, THEN 4 tablets (40 mg total) daily with breakfast for 1 day, THEN 3 tablets (30 mg total) daily with breakfast for 1 day, THEN 2 tablets (20 mg total) daily with breakfast for 1 day, THEN 1 tablet (10 mg total) daily with breakfast for 1 day.       08/10/2024    2:57 PM 09/11/2023    9:32 AM 07/14/2023    8:45 AM 02/14/2023    1:55 PM  GAD 7 : Generalized Anxiety Score  Nervous, Anxious, on Edge 0 0 0 0  Control/stop worrying 0 0 0 0  Worry too much - different things 0 0 0 0  Trouble relaxing 0 0 0 0  Restless 0 0 0 0  Easily annoyed or irritable 0 3 0 0  Afraid - awful might happen 0 0 0 0  Total GAD 7 Score 0 3 0 0  Anxiety Difficulty Not difficult at all Not difficult at all Not difficult at all Not difficult at all       08/10/2024    2:57 PM 09/11/2023    9:32 AM 07/14/2023    8:45 AM  Depression screen PHQ 2/9  Decreased Interest 0 0 0  Down, Depressed, Hopeless 0 0 0  PHQ - 2 Score 0 0 0  Altered sleeping 0 3 3  Tired, decreased energy 0 3 3  Change in appetite 0 0 0  Feeling bad or failure about yourself  0 0 0  Trouble concentrating 0 0 0  Moving slowly or fidgety/restless 0 0 0  Suicidal thoughts 0 0 0  PHQ-9 Score 0 6 6  Difficult doing work/chores Not difficult at all Not difficult at all Not difficult at all    BP Readings from Last 3 Encounters:  08/10/24 122/78  08/06/24 123/84  01/26/24 112/78    Physical Exam Vitals and nursing note reviewed.  Constitutional:      General: She is not in acute distress.    Appearance: Normal appearance. She is well-developed.  HENT:     Head: Normocephalic  and atraumatic.     Right Ear: Tympanic membrane is retracted. Tympanic membrane is not erythematous.     Left Ear: Tympanic membrane is retracted. Tympanic membrane is not erythematous.     Nose:     Right Sinus: No maxillary sinus tenderness or frontal sinus tenderness.     Left Sinus: No maxillary sinus tenderness or frontal sinus tenderness.     Mouth/Throat:  Pharynx: Oropharynx is clear.  Eyes:     Extraocular Movements:     Right eye: Nystagmus present.     Left eye: Nystagmus present.  Cardiovascular:     Rate and Rhythm: Normal rate and regular rhythm.  Pulmonary:     Effort: Pulmonary effort is normal. No respiratory distress.     Breath sounds: No wheezing or rhonchi.  Musculoskeletal:     Cervical back: Normal range of motion.     Right lower leg: No edema.     Left lower leg: No edema.  Skin:    General: Skin is warm and dry.     Findings: No rash.  Neurological:     Mental Status: She is alert and oriented to person, place, and time.  Psychiatric:        Mood and Affect: Mood normal.        Behavior: Behavior normal.     Wt Readings from Last 3 Encounters:  08/10/24 213 lb (96.6 kg)  08/06/24 210 lb 15.7 oz (95.7 kg)  01/26/24 211 lb (95.7 kg)    BP 122/78   Pulse 80   Ht 5' 6 (1.676 m)   Wt 213 lb (96.6 kg)   LMP 06/15/2021 (Exact Date)   SpO2 97%   BMI 34.38 kg/m   Assessment and Plan:  Problem List Items Addressed This Visit       Unprioritized   Menopause syndrome   She is doing well on HRT - no hot flashes or sweats. She was out of medications for several weeks recently and symptoms returned. Will continue same dose for now - consider a trial of lower routine dosing in the future.      Other Visit Diagnoses       Dysfunction of both eustachian tubes    -  Primary   begin flonase  spray steroid taper   Relevant Medications   predniSONE  (DELTASONE ) 10 MG tablet     Sinus headache       should improve with steroids if s/s of  bacterial infection occur, call for antibiotics       No follow-ups on file.    Leita HILARIO Adie, MD [facility] [place]

## 2024-08-23 ENCOUNTER — Other Ambulatory Visit: Payer: Self-pay

## 2024-08-23 ENCOUNTER — Telehealth: Payer: Self-pay

## 2024-08-23 MED ORDER — NA SULFATE-K SULFATE-MG SULF 17.5-3.13-1.6 GM/177ML PO SOLN
1.0000 | Freq: Once | ORAL | 0 refills | Status: AC
Start: 2024-08-23 — End: 2024-08-23

## 2024-08-23 MED ORDER — PEG 3350-KCL-NA BICARB-NACL 420 G PO SOLR: 4000.0000 mL | Freq: Once | ORAL | 0 refills | Status: AC

## 2024-08-23 NOTE — Progress Notes (Signed)
 Suprep has been sent to pharmacy for patient's colonoscopy scheduled on 08/30/24.  Asked her to call me back if her prep needed to be changed to a different one due to insurance.  Thanks,  Alvarado, CMA

## 2024-08-23 NOTE — Telephone Encounter (Signed)
 Suprep not covered by insurance prep has been changed from Suprep to [employer].  Pharmacy has been notified of prep change as rx sent in epic.  Instructions for Nulytely given to patient verbally and has been sent to her via [employer].  Thanks,  [place], CMA

## 2024-08-30 ENCOUNTER — Encounter: Payer: Self-pay | Admitting: Gastroenterology

## 2024-08-30 ENCOUNTER — Ambulatory Visit: Admitting: Anesthesiology

## 2024-08-30 ENCOUNTER — Ambulatory Visit
Admission: RE | Admit: 2024-08-30 | Discharge: 2024-08-30 | Disposition: A | Discharge status: Home / Self Care | Attending: Gastroenterology | Admitting: Gastroenterology

## 2024-08-30 ENCOUNTER — Encounter
Admission: RE | Disposition: A | Discharge status: Home / Self Care | Payer: Self-pay | Source: Home / Self Care | Attending: Gastroenterology

## 2024-08-30 DIAGNOSIS — K640 First degree hemorrhoids: Secondary | ICD-10-CM | POA: Diagnosis not present

## 2024-08-30 DIAGNOSIS — Z1211 Encounter for screening for malignant neoplasm of colon: Secondary | ICD-10-CM

## 2024-08-30 DIAGNOSIS — K635 Polyp of colon: Secondary | ICD-10-CM

## 2024-08-30 DIAGNOSIS — F17210 Nicotine dependence, cigarettes, uncomplicated: Secondary | ICD-10-CM | POA: Diagnosis not present

## 2024-08-30 DIAGNOSIS — D125 Benign neoplasm of sigmoid colon: Secondary | ICD-10-CM | POA: Diagnosis not present

## 2024-08-30 DIAGNOSIS — Z860100 Personal history of colon polyps, unspecified: Secondary | ICD-10-CM

## 2024-08-30 DIAGNOSIS — D120 Benign neoplasm of cecum: Secondary | ICD-10-CM

## 2024-08-30 DIAGNOSIS — Z860101 Personal history of adenomatous and serrated colon polyps: Secondary | ICD-10-CM

## 2024-08-30 HISTORY — DX: Personal history of colon polyps, unspecified: Z86.0100

## 2024-08-30 HISTORY — PX: COLONOSCOPY: SHX5424

## 2024-08-30 HISTORY — PX: POLYPECTOMY: SHX149

## 2024-08-30 HISTORY — DX: Polyp of colon: K63.5

## 2024-08-30 SURGERY — COLONOSCOPY
Anesthesia: General

## 2024-08-30 MED ORDER — PROPOFOL 1000 MG/100ML IV EMUL
INTRAVENOUS | Status: AC
Start: 2024-08-30 — End: 2024-08-30
  Filled 2024-08-30: qty 100

## 2024-08-30 MED ORDER — PROPOFOL 10 MG/ML IV BOLUS
INTRAVENOUS | Status: DC | PRN
  Administered 2024-08-30 (×3): 50 mg via INTRAVENOUS
  Administered 2024-08-30: 100 mg via INTRAVENOUS

## 2024-08-30 MED ORDER — SODIUM CHLORIDE 0.9 % IV SOLN: INTRAVENOUS | Status: DC

## 2024-08-30 MED ORDER — LIDOCAINE HCL (PF) 2 % IJ SOLN
INTRAMUSCULAR | Status: AC
  Filled 2024-08-30: qty 5

## 2024-08-30 MED ORDER — LIDOCAINE HCL (CARDIAC) PF 100 MG/5ML IV SOSY
PREFILLED_SYRINGE | INTRAVENOUS | Status: DC | PRN
  Administered 2024-08-30: 100 mg via INTRAVENOUS

## 2024-08-30 MED ORDER — PROPOFOL 500 MG/50ML IV EMUL
INTRAVENOUS | Status: DC | PRN
  Administered 2024-08-30: 150 ug/kg/min via INTRAVENOUS

## 2024-08-30 NOTE — Anesthesia Preprocedure Evaluation (Addendum)
 Anesthesia Evaluation  Patient identified by MRN, date of birth, ID band Patient awake    Reviewed: Allergy & Precautions, H&P , NPO status , Patient's Chart, lab work & pertinent test results  Airway Mallampati: II  TM Distance: >3 FB Neck ROM: full    Dental no notable dental hx.    Pulmonary Current Smoker and Patient abstained from smoking.   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 08/17/2021: COLONOSCOPY; N/A     Comment:  Procedure: COLONOSCOPY;  Surgeon: Therisa Bi, MD;                Location: [facility] ENDOSCOPY;  Service: Gastroenterology;                Laterality: N/A;  BMI    Body Mass Index: 34.86 kg/m      Reproductive/Obstetrics negative OB ROS                              Anesthesia Physical Anesthesia Plan  ASA: 2  Anesthesia Plan: General   Post-op Pain Management:    Induction:   PONV Risk Score and Plan: Propofol  infusion and TIVA  Airway Management Planned: Natural Airway  Additional Equipment:   Intra-op Plan:   Post-operative Plan:   Informed Consent: I have reviewed the patients History and Physical, chart, labs and discussed the procedure including the risks, benefits and alternatives for the proposed anesthesia with the patient or authorized representative who has indicated his/her understanding and acceptance.     Dental Advisory Given  Plan Discussed with: CRNA and  Surgeon  Anesthesia Plan Comments:          Anesthesia Quick Evaluation

## 2024-08-30 NOTE — Transfer of Care (Signed)
 Immediate Anesthesia Transfer of Care Note  Patient: Jessica Robles  Procedure(s) Performed: COLONOSCOPY POLYPECTOMY, INTESTINE  Patient Location: Endoscopy Unit  Anesthesia Type:General  Level of Consciousness: awake and alert   Airway & Oxygen Therapy: Patient Spontanous Breathing  Post-op Assessment: Report given to RN and Post -op Vital signs reviewed and stable  Post vital signs: Reviewed and stable  Last Vitals:  Vitals Value Taken Time  BP 96/52 08/30/24 08:00  Temp 35.4 C 08/30/24 08:00  Pulse 87 08/30/24 08:00  Resp 18 08/30/24 08:00  SpO2 100 % 08/30/24 08:00    Last Pain:  Vitals:   08/30/24 0800  TempSrc: Tympanic  PainSc: 0-No pain         Complications: No notable events documented.

## 2024-08-30 NOTE — Anesthesia Postprocedure Evaluation (Signed)
 Anesthesia Post Note  Patient: Jessica Robles  Procedure(s) Performed: COLONOSCOPY POLYPECTOMY, INTESTINE  Patient location during evaluation: Endoscopy Anesthesia Type: General Level of consciousness: awake and alert Pain management: pain level controlled Vital Signs Assessment: post-procedure vital signs reviewed and stable Respiratory status: spontaneous breathing, nonlabored ventilation and respiratory function stable Cardiovascular status: blood pressure returned to baseline and stable Postop Assessment: no apparent nausea or vomiting Anesthetic complications: no   No notable events documented.   Last Vitals:  Vitals:   08/30/24 0809 08/30/24 0818  BP: 108/64 119/71  Pulse: 76 77  Resp: 16 16  Temp:    SpO2: 100% 100%    Last Pain:  Vitals:   08/30/24 0818  TempSrc:   PainSc: 0-No pain                 Camellia Merilee Louder

## 2024-08-30 NOTE — H&P (Signed)
 [name], MD [facility] [address], [address] [place], [place] [address] Phone:[phone] Fax : [phone]  Primary Care Physician:  Justus Leita DEL, MD Primary Gastroenterologist:  Dr. Copping  Pre-Procedure History & Physical: HPI:  Jessica Robles is a 53 y.o. female is here for an colonoscopy.   No past medical history on file.  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 Authorizing Provider  celecoxib  (CELEBREX ) 100 MG capsule Take 1 capsule (100 mg total) by mouth 2 (two) times daily. 08/05/24   Justus Leita DEL, MD  estradiol  (ESTRACE ) 0.5 MG tablet Take 1 tablet (0.5 mg total) by mouth daily. 08/05/24   Justus Leita DEL, MD  medroxyPROGESTERone  (PROVERA ) 2.5 MG tablet Take 1 tablet (2.5 mg total) by mouthdaily. 08/05/24   Justus Leita DEL, MD    Allergies as of 05/17/2024   (No Known Allergies)    Family History  Problem Relation Age of Onset   Cancer Mother        gall bladder with mets   Hypertension Father    Esophageal cancer Sister    Diabetes Maternal Grandmother    Heart disease Maternal Grandmother    Stroke Maternal Grandmother    Cancer Maternal Grandfather    Breast cancer Neg Hx     Social History   Socioeconomic History   Marital status: Married    Spouse name: Not on file   Number of children: 2   Years of education: Not on file   Highest education level: Not on file  Occupational History   Not on file  Tobacco Use   Smoking status: Every Day    Types: E-cigarettes   Smokeless tobacco: Never   Tobacco comments:    Qit smoking tobacco 2022  Vaping Use   Vaping status: Every Day   Start date: 09/15/2021   Substances:  Nicotine, Flavoring  Substance and Sexual Activity   Alcohol use: Yes    Comment: occasional   Drug use: Never   Sexual activity: Yes  Other Topics Concern   Not on file  Social History Narrative   Not on file   Social Drivers of Health   Financial Resource Strain: Low Risk  (02/14/2023)   Overall Financial Resource Strain (CARDIA)    Difficulty of Paying Living Expenses: Not hard at all  Food Insecurity: No Food Insecurity (02/14/2023)   Hunger Vital Sign    Worried About Running Out of Food in the Last Year: Never true    Ran Out of Food in the Last Year: Never true  Transportation Needs: No Transportation Needs (02/14/2023)   PRAPARE - Administrator, Civil Service (Medical): No    Lack of Transportation (Non-Medical): No  Physical Activity: Not on file  Stress: Not on file  Social Connections: Not on file  Intimate Partner Violence: Not At Risk (02/14/2023)   Humiliation, Afraid, Rape, and Kick questionnaire    Fear of Current or Ex-Partner: No    Emotionally Abused: No    Physically Abused: No    Sexually Abused: No    Review of Systems: See HPI, otherwise negative ROS  Physical Exam: BP 132/75   Pulse 74  Temp (!) 96.5 F (35.8 C) (Temporal)   Resp 16   Ht 5' 6 (1.676 m)   Wt 98 kg   LMP 06/15/2021 (Exact Date)   SpO2 100%   BMI 34.86 kg/m  General:   Alert,  pleasant and cooperative in NAD Head:  Normocephalic and atraumatic. Neck:  Supple; no masses or thyromegaly. Lungs:  Clear throughout to auscultation.    Heart:  Regular rate and rhythm. Abdomen:  Soft, nontender and nondistended. Normal bowel sounds, without guarding, and without rebound.   Neurologic:  Alert and  oriented x4;  grossly normal neurologically.  Impression/Plan: Kathie A Pingley is here for an colonoscopy to be performed for a history of adenomatous polyps on 2022   Risks, benefits, limitations, and alternatives regarding  colonoscopy have been reviewed with the patient.   Questions have been answered.  All parties agreeable.   [name], MD  08/30/2024, 7:29 AM

## 2024-08-30 NOTE — Op Note (Signed)
 [facility] Gastroenterology Patient Name: Jessica Robles Procedure Date: 08/30/2024 7:18 AM MRN: [id number] Account #: [id number] Date of Birth: 05/23/1971 Admit Type: Outpatient Age: 53 Room: [facility] ENDO ROOM 4 Gender: Female Note Status: Finalized Instrument Name: Colon Scope [id number] Procedure:             Colonoscopy Indications:           High risk colon cancer surveillance: Personal history                         of colonic polyps Providers:             [name] MD, MD Referring MD:          Leita Adie, MD (Referring MD) Medicines:             Propofol  per Anesthesia Complications:         No immediate complications. Procedure:             Pre-Anesthesia Assessment:                        - Prior to the procedure, a History and Physical was                         performed, and patient medications and allergies were                         reviewed. The patient's tolerance of previous                         anesthesia was also reviewed. The risks and benefits                         of the procedure and the sedation options and risks                         were discussed with the patient. All questions were                         answered, and informed consent was obtained. Prior                         Anticoagulants: The patient has taken no anticoagulant                         or antiplatelet agents. ASA Grade Assessment: II - A                         patient with mild systemic disease. After reviewing                         the risks and benefits, the patient was deemed in                         satisfactory condition to undergo the procedure.                        After obtaining informed consent, the colonoscope was  passed under direct vision. Throughout the procedure,                         the patient's blood pressure, pulse, and oxygen                         saturations were monitored continuously. The                          Colonoscope was introduced through the anus and                         advanced to the the cecum, identified by appendiceal                         orifice and ileocecal valve. The colonoscopy was                         performed without difficulty. The patient tolerated                         the procedure well. The quality of the bowel                         preparation was excellent. Findings:      The perianal and digital rectal examinations were normal.      A 3 mm polyp was found in the cecum. The polyp was sessile. The polyp       was removed with a cold snare. Resection and retrieval were complete.      A 3 mm polyp was found in the sigmoid colon. The polyp was sessile. The       polyp was removed with a cold snare. Resection and retrieval were       complete.      Non-bleeding internal hemorrhoids were found during retroflexion. The       hemorrhoids were Grade I (internal hemorrhoids that do not prolapse). Impression:            - One 3 mm polyp in the cecum, removed with a cold                         snare. Resected and retrieved.                        - One 3 mm polyp in the sigmoid colon, removed with a                         cold snare. Resected and retrieved.                        - Non-bleeding internal hemorrhoids. Recommendation:        - Discharge patient to home.                        - Resume previous diet.                        - Continue present medications.                        -  Await pathology results.                        - Repeat colonoscopy in 5 years for surveillance. Procedure Code(s):     --- Professional ---                        [phone], Colonoscopy, flexible; with removal of                         tumor(s), polyp(s), or other lesion(s) by snare                         technique Diagnosis Code(s):     --- Professional ---                        Z86.010, Personal history of colonic polyps                        D12.5, Benign  neoplasm of sigmoid colon CPT copyright 2022 American Medical Association. All rights reserved. The codes documented in this report are preliminary and upon coder review may  be revised to meet current compliance requirements. [name] MD, MD 08/30/2024 7:56:44 AM This report has been signed electronically. Number of Addenda: 0 Note Initiated On: 08/30/2024 7:18 AM Scope Withdrawal Time: 0 hours 8 minutes 17 seconds  Total Procedure Duration: 0 hours 14 minutes 41 seconds  Estimated Blood Loss:  Estimated blood loss: none.      [facility]

## 2024-08-31 ENCOUNTER — Ambulatory Visit: Payer: Self-pay | Admitting: Gastroenterology

## 2024-08-31 LAB — SURGICAL PATHOLOGY

## 2024-09-17 ENCOUNTER — Other Ambulatory Visit: Payer: Self-pay | Admitting: Internal Medicine

## 2024-09-17 DIAGNOSIS — M25552 Pain in left hip: Secondary | ICD-10-CM

## 2024-09-17 DIAGNOSIS — N951 Menopausal and female climacteric states: Secondary | ICD-10-CM

## 2024-09-17 NOTE — Telephone Encounter (Signed)
 Requested Prescriptions  Pending Prescriptions Disp Refills   celecoxib  (CELEBREX ) 100 MG capsule [Pharmacy Med Name: CELECOXIB  100 MG CAPSULE] 60 capsule 0    Sig: Take 1 capsule (100 mg total) by mouth 2 (two) times daily.     Analgesics:  COX2 Inhibitors Failed - 09/17/2024  5:58 PM      Failed - Manual Review: Labs are only required if the patient has taken medication for more than 8 weeks.      Failed - HGB in normal range and within 360 days    Hemoglobin  Date Value Ref Range Status  09/11/2023 13.7 11.1 - 15.9 g/dL Final         Failed - Cr in normal range and within 360 days    Creatinine, Ser  Date Value Ref Range Status  09/11/2023 0.76 0.57 - 1.00 mg/dL Final         Failed - HCT in normal range and within 360 days    Hematocrit  Date Value Ref Range Status  09/11/2023 43.3 34.0 - 46.6 % Final         Failed - AST in normal range and within 360 days    AST  Date Value Ref Range Status  09/11/2023 26 0 - 40 IU/L Final         Failed - ALT in normal range and within 360 days    ALT  Date Value Ref Range Status  09/11/2023 36 (H) 0 - 32 IU/L Final         Failed - eGFR is 30 or above and within 360 days    eGFR  Date Value Ref Range Status  09/11/2023 94 >59 mL/min/1.73 Final         Passed - Patient is not pregnant      Passed - Valid encounter within last 12 months    Recent Outpatient Visits           1 month ago Dysfunction of both eustachian tubes   [place] [facility], Leita DEL, MD   7 months ago Acute cough   [place] [facility] at [facility], MD               estradiol  (ESTRACE ) 0.5 MG tablet [Pharmacy Med Name: ESTRADIOL  0.5 MG TABLET] 90 tablet 1    Sig: Take 1 tablet (0.5 mg total) by mouth daily.     OB/GYN:  Estrogens Failed - 09/17/2024  5:58 PM      Failed - Mammogram is up-to-date per Health Maintenance      Passed - Last BP in  normal range    BP Readings from Last 1 Encounters:  08/30/24 119/71         Passed - Valid encounter within last 12 months    Recent Outpatient Visits           1 month ago Dysfunction of both eustachian tubes   [facility] at [facility], Leita DEL, MD   7 months ago Acute cough   [place] [facility] JAYSON, MD

## 2024-09-17 NOTE — Telephone Encounter (Signed)
 Called pharmacy to cancel refill of estradol. PT is on 2 hormone replacements. Will forward to [facility] for review by provider.

## 2024-09-17 NOTE — Telephone Encounter (Signed)
 Requested medications are due for refill today.  yes  Requested medications are on the active medications list.  yes  Last refill. 08/05/2024 #60 0 rf  Future visit scheduled.   yes  Notes to clinic.  Labs are expired    Requested Prescriptions  Pending Prescriptions Disp Refills   celecoxib  (CELEBREX ) 100 MG capsule [Pharmacy Med Name: CELECOXIB  100 MG CAPSULE] 60 capsule 0    Sig: Take 1 capsule (100 mg total) by mouth 2 (two) times daily.     Analgesics:  COX2 Inhibitors Failed - 09/17/2024  5:58 PM      Failed - Manual Review: Labs are only required if the patient has taken medication for more than 8 weeks.      Failed - HGB in normal range and within 360 days    Hemoglobin  Date Value Ref Range Status  09/11/2023 13.7 11.1 - 15.9 g/dL Final         Failed - Cr in normal range and within 360 days    Creatinine, Ser  Date Value Ref Range Status  09/11/2023 0.76 0.57 - 1.00 mg/dL Final         Failed - HCT in normal range and within 360 days    Hematocrit  Date Value Ref Range Status  09/11/2023 43.3 34.0 - 46.6 % Final         Failed - AST in normal range and within 360 days    AST  Date Value Ref Range Status  09/11/2023 26 0 - 40 IU/L Final         Failed - ALT in normal range and within 360 days    ALT  Date Value Ref Range Status  09/11/2023 36 (H) 0 - 32 IU/L Final         Failed - eGFR is 30 or above and within 360 days    eGFR  Date Value Ref Range Status  09/11/2023 94 >59 mL/min/1.73 Final         Passed - Patient is not pregnant      Passed - Valid encounter within last 12 months    Recent Outpatient Visits           1 month ago Dysfunction of both eustachian tubes   [place] [facility], Leita DEL, MD   7 months ago Acute cough   [place] [facility], MD              Signed Prescriptions Disp Refills   estradiol  (ESTRACE ) 0.5  MG tablet 90 tablet 1    Sig: Take 1 tablet (0.5 mg total) by mouth daily.     OB/GYN:  Estrogens Failed - 09/17/2024  5:58 PM      Failed - Mammogram is up-to-date per Health Maintenance      Passed - Last BP in normal range    BP Readings from Last 1 Encounters:  08/30/24 119/71         Passed - Valid encounter within last 12 months    Recent Outpatient Visits           1 month ago Dysfunction of both eustachian tubes   [facility] at [facility], Leita DEL, MD   7 months ago Acute cough   [place] [facility], MD

## 2024-09-17 NOTE — Telephone Encounter (Signed)
 Requested medications are due for refill today.  unsure  Requested medications are on the active medications list.  yes  Last refill. 08/05/2024 #30 0 rf  Future visit scheduled.   yes  Notes to clinic.  Pt is on 2 hormone replacements. Please review.    Requested Prescriptions  Pending Prescriptions Disp Refills   medroxyPROGESTERone  (PROVERA ) 2.5 MG tablet [Pharmacy Med Name: MEDROXYPROGESTERONE  2.5 MG TAB] 30 tablet 0    Sig: Take 1 tablet (2.5 mg total) by mouth daily.     OB/GYN:  Progestins Failed - 09/17/2024  6:10 PM      Failed - Patient is not a smoker      Passed - Last BP in normal range    BP Readings from Last 1 Encounters:  08/30/24 119/71         Passed - Valid encounter within last 12 months    Recent Outpatient Visits           1 month ago Dysfunction of both eustachian tubes   [facility] at [facility], Leita DEL, MD   7 months ago Acute cough   [place] [facility], MD

## 2024-10-05 ENCOUNTER — Other Ambulatory Visit: Payer: Self-pay

## 2024-10-05 ENCOUNTER — Telehealth: Payer: Self-pay

## 2024-10-05 ENCOUNTER — Other Ambulatory Visit: Payer: Self-pay | Admitting: Internal Medicine

## 2024-10-05 DIAGNOSIS — N951 Menopausal and female climacteric states: Secondary | ICD-10-CM

## 2024-10-05 MED ORDER — ESTRADIOL 0.5 MG PO TABS: 0.5000 mg | ORAL_TABLET | Freq: Every day | ORAL | 0 refills | Status: DC

## 2024-10-05 NOTE — Telephone Encounter (Signed)
 Copied from CRM #[id number]. Topic: Clinical - Medication Question >> Oct 05, 2024 11:39 AM Myrick T wrote: Reason for CRM: patient stated she called pharmacy to get her estradiol  (ESTRACE ) 0.5 MG tablet and they did not have the script on file. Please f/u with pharmacy to possibly resend script

## 2024-10-05 NOTE — Telephone Encounter (Signed)
 Refill re-sent

## 2024-10-14 ENCOUNTER — Ambulatory Visit
Admission: RE | Admit: 2024-10-14 | Discharge: 2024-10-14 | Disposition: A | Discharge status: Home / Self Care | Source: Ambulatory Visit | Attending: Internal Medicine | Admitting: Internal Medicine

## 2024-10-14 DIAGNOSIS — Z1231 Encounter for screening mammogram for malignant neoplasm of breast: Secondary | ICD-10-CM | POA: Diagnosis not present

## 2024-11-03 ENCOUNTER — Encounter: Payer: Self-pay | Admitting: Internal Medicine

## 2024-11-03 ENCOUNTER — Ambulatory Visit (INDEPENDENT_AMBULATORY_CARE_PROVIDER_SITE_OTHER): Admitting: Internal Medicine

## 2024-11-03 VITALS — BP 118/60 | HR 87 | Ht 66.0 in | Wt 219.0 lb

## 2024-11-03 DIAGNOSIS — Z791 Long term (current) use of non-steroidal anti-inflammatories (NSAID): Secondary | ICD-10-CM

## 2024-11-03 DIAGNOSIS — Z23 Encounter for immunization: Secondary | ICD-10-CM

## 2024-11-03 DIAGNOSIS — Z0000 Encounter for general adult medical examination without abnormal findings: Secondary | ICD-10-CM | POA: Diagnosis not present

## 2024-11-03 DIAGNOSIS — Z1211 Encounter for screening for malignant neoplasm of colon: Secondary | ICD-10-CM

## 2024-11-03 DIAGNOSIS — Z131 Encounter for screening for diabetes mellitus: Secondary | ICD-10-CM

## 2024-11-03 DIAGNOSIS — F5101 Primary insomnia: Secondary | ICD-10-CM | POA: Insufficient documentation

## 2024-11-03 DIAGNOSIS — E782 Mixed hyperlipidemia: Secondary | ICD-10-CM | POA: Diagnosis not present

## 2024-11-03 DIAGNOSIS — N951 Menopausal and female climacteric states: Secondary | ICD-10-CM

## 2024-11-03 DIAGNOSIS — M25552 Pain in left hip: Secondary | ICD-10-CM

## 2024-11-03 DIAGNOSIS — Z1231 Encounter for screening mammogram for malignant neoplasm of breast: Secondary | ICD-10-CM | POA: Diagnosis not present

## 2024-11-03 MED ORDER — MEDROXYPROGESTERONE ACETATE 2.5 MG PO TABS: 2.5000 mg | ORAL_TABLET | Freq: Every day | ORAL | 1 refills | Status: AC

## 2024-11-03 MED ORDER — CELECOXIB 100 MG PO CAPS: 100.0000 mg | ORAL_CAPSULE | Freq: Two times a day (BID) | ORAL | 1 refills | Status: AC

## 2024-11-03 MED ORDER — ESTRADIOL 0.5 MG PO TABS: 0.5000 mg | ORAL_TABLET | Freq: Every day | ORAL | 1 refills | Status: AC

## 2024-11-03 NOTE — Assessment & Plan Note (Signed)
 Managed with diet only. Lab Results  Component Value Date   LDLCALC 146 (H) 09/11/2023

## 2024-11-03 NOTE — Assessment & Plan Note (Signed)
 Doing well on HRT - no hot flashes or sweats, mood stable, no vaginal bleeding  Will continue current therapy.

## 2024-11-03 NOTE — Assessment & Plan Note (Signed)
 Takes Celebrex  1-2 times per day. 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

## 2024-11-03 NOTE — Progress Notes (Signed)
 Date:  11/03/2024   Name:  Jessica Robles   DOB:  05-13-1971   MRN:  [id number]   Chief Complaint: Annual Exam Jessica Robles is a 53 y.o. female who presents today for her Complete Annual Exam. She feels fairly well. She reports exercising- none. She reports she is sleeping poorly. Breast complaints - none.  Health Maintenance  Topic Date Due   COVID-19 Vaccine (1) Never done   HIV Screening  Never done   DTaP/Tdap/Td vaccine (1 - Tdap) Never done   Pneumococcal Vaccine for age over 39 (1 of 2 - PCV) Never done   Hepatitis B Vaccine (1 of 3 - 19+ 3-dose series) Never done   Zoster (Shingles) Vaccine (1 of 2) Never done   Breast Cancer Screening  10/14/2025   Pap with HPV screening  07/18/2026   Colon Cancer Screening  08/30/2029   Flu Shot  Completed   Hepatitis C Screening  Completed   HPV Vaccine  Aged Out   Meningitis B Vaccine  Aged Out    Insomnia Primary symptoms: sleep disturbance, difficulty falling asleep, frequent awakening.   The problem occurs nightly. The problem is unchanged. Treatments tried: tired CBD/THC gummies but could not use while under pain management. The treatment provided significant relief.    Review of Systems  Constitutional:  Negative for fatigue and unexpected weight change.  HENT:  Negative for trouble swallowing.   Eyes:  Negative for visual disturbance.  Respiratory:  Negative for cough, chest tightness, shortness of breath and wheezing.   Cardiovascular:  Negative for chest pain, palpitations and leg swelling.  Gastrointestinal:  Negative for abdominal pain, constipation and diarrhea.  Genitourinary:  Negative for dysuria, hematuria and menstrual problem.  Musculoskeletal:  Positive for arthralgias and back pain. Negative for myalgias.  Neurological:  Negative for dizziness, weakness, light-headedness and headaches.  Psychiatric/Behavioral:  Positive for sleep disturbance. Negative for dysphoric mood. The patient has insomnia. The patient  is not nervous/anxious.      Lab Results  Component Value Date   NA 142 09/11/2023   K 4.4 09/11/2023   CO2 24 09/11/2023   GLUCOSE 112 (H) 09/11/2023   BUN 24 09/11/2023   CREATININE 0.76 09/11/2023   CALCIUM 9.4 09/11/2023   EGFR 94 09/11/2023   Lab Results  Component Value Date   CHOL 221 (H) 09/11/2023   HDL 53 09/11/2023   LDLCALC 146 (H) 09/11/2023   TRIG 124 09/11/2023   CHOLHDL 4.2 09/11/2023   Lab Results  Component Value Date   TSH 1.710 09/11/2023   Lab Results  Component Value Date   HGBA1C 5.4 02/10/2023   Lab Results  Component Value Date   WBC 6.9 09/11/2023   HGB 13.7 09/11/2023   HCT 43.3 09/11/2023   MCV 91 09/11/2023   PLT 344 09/11/2023   Lab Results  Component Value Date   ALT 36 (H) 09/11/2023   AST 26 09/11/2023   ALKPHOS 101 09/11/2023   BILITOT 0.3 09/11/2023   Lab Results  Component Value Date   25OHVITD2 <1.0 03/10/2023   25OHVITD3 19 03/10/2023   VD25OH 46.9 07/14/2023     Patient Active Problem List   Diagnosis Date Noted   Primary insomnia 11/03/2024   Menopause syndrome 09/11/2023   Lumbosacral facet arthropathy (Multilevel) (Bilateral) 03/24/2023   Chronic low back pain (1ry area of Pain) (Bilateral) w/o sciatica 03/24/2023   Vitamin D  deficiency 03/24/2023   Chronic hip pain (3ry area of Pain) (Left)  03/10/2023   Hyperlipidemia, mixed 02/10/2023   BMI 36.0-36.9,adult 02/10/2023   Arthralgia of hip (Left) 10/03/2022   Chronic idiopathic constipation 09/30/2022   Greater trochanteric pain syndrome (Left) 08/22/2022   Chronic low back pain (1ry area of Pain) (Bilateral) (L>R) w/ sciatica (Left) 08/22/2022   Cervical radiculopathy 03/07/2021    No Known Allergies  Past Surgical History:  Procedure Laterality Date   BREAST BIOPSY Right 10/04/2021   u/s bx, 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   COLONOSCOPY N/A 08/17/2021   Procedure: COLONOSCOPY;  Surgeon: Therisa Bi, MD;  Location: [facility] ENDOSCOPY;  Service:  Gastroenterology;  Laterality: N/A;   COLONOSCOPY N/A 08/30/2024   Procedure: COLONOSCOPY;  Surgeon: Jinny Carmine, MD;  Location: [facility] ENDOSCOPY;  Service: Endoscopy;  Laterality: N/A;   POLYPECTOMY  08/30/2024   Procedure: POLYPECTOMY, INTESTINE;  Surgeon: Jinny Carmine, MD;  Location: [facility] ENDOSCOPY;  Service: Endoscopy;;    Social History   Tobacco Use   Smoking status: Every Day    Types: E-cigarettes   Smokeless tobacco: Never   Tobacco comments:    Qit smoking tobacco 2022  Vaping Use   Vaping status: Every Day   Start date: 09/15/2021   Substances: Nicotine, Flavoring  Substance Use Topics   Alcohol use: Yes    Comment: occasional   Drug use: Never     Medication list has been reviewed and updated.  Current Meds  Medication Sig   [DISCONTINUED] celecoxib  (CELEBREX ) 100 MG capsule Take 1 capsule (100 mg total) by mouth 2 (two) times daily.   [DISCONTINUED] estradiol  (ESTRACE ) 0.5 MG tablet Take 1 tablet (0.5 mg total) by mouth daily.   [DISCONTINUED] medroxyPROGESTERone  (PROVERA ) 2.5 MG tablet Take 1 tablet (2.5 mg total) by mouth daily.       11/03/2024   10:33 AM 08/10/2024    2:57 PM 09/11/2023    9:32 AM 07/14/2023    8:45 AM  GAD 7 : Generalized Anxiety Score  Nervous, Anxious, on Edge 0 0 0 0  Control/stop worrying 0 0 0 0  Worry too much - different things 0 0 0 0  Trouble relaxing 0 0 0 0  Restless 0 0 0 0  Easily annoyed or irritable 0 0 3 0  Afraid - awful might happen 0 0 0 0  Total GAD 7 Score 0 0 3 0  Anxiety Difficulty Not difficult at all Not difficult at all Not difficult at all Not difficult at all       11/03/2024   10:33 AM 08/10/2024    2:57 PM 09/11/2023    9:32 AM  Depression screen PHQ 2/9  Decreased Interest 0 0 0  Down, Depressed, Hopeless 0 0 0  PHQ - 2 Score 0 0 0  Altered sleeping 0 0 3  Tired, decreased energy 0 0 3  Change in appetite 0 0 0  Feeling bad or failure about yourself  0 0 0  Trouble concentrating 0 0 0  Moving  slowly or fidgety/restless 0 0 0  Suicidal thoughts 0 0 0  PHQ-9 Score 0 0  6   Difficult doing work/chores Not difficult at all Not difficult at all Not difficult at all     Data saved with a previous flowsheet row definition    BP Readings from Last 3 Encounters:  11/03/24 118/60  08/30/24 119/71  08/10/24 122/78    Physical Exam Vitals and nursing note reviewed.  Constitutional:      General: She is not in acute distress.  Appearance: Normal appearance. She is well-developed.  HENT:     Head: Normocephalic and atraumatic.     Right Ear: Tympanic membrane and ear canal normal.     Left Ear: Tympanic membrane and ear canal normal.     Nose:     Right Sinus: No maxillary sinus tenderness.     Left Sinus: No maxillary sinus tenderness.  Eyes:     General: No scleral icterus.       Right eye: No discharge.        Left eye: No discharge.     Conjunctiva/sclera: Conjunctivae normal.  Neck:     Thyroid : No thyromegaly.     Vascular: No carotid bruit.  Cardiovascular:     Rate and Rhythm: Normal rate and regular rhythm.     Pulses: Normal pulses.     Heart sounds: Normal heart sounds.  Pulmonary:     Effort: Pulmonary effort is normal. No respiratory distress.     Breath sounds: No wheezing.  Abdominal:     General: Bowel sounds are normal.     Palpations: Abdomen is soft.     Tenderness: There is no abdominal tenderness.  Musculoskeletal:     Cervical back: Normal range of motion. No erythema.     Right lower leg: No edema.     Left lower leg: No edema.  Lymphadenopathy:     Cervical: No cervical adenopathy.  Skin:    General: Skin is warm and dry.     Findings: No rash.  Neurological:     General: No focal deficit present.     Mental Status: She is alert and oriented to person, place, and time.     Cranial Nerves: No cranial nerve deficit.     Sensory: No sensory deficit.     Deep Tendon Reflexes: Reflexes are normal and symmetric.  Psychiatric:         Attention and Perception: Attention normal.        Mood and Affect: Mood normal.     Wt Readings from Last 3 Encounters:  11/03/24 219 lb (99.3 kg)  08/30/24 216 lb (98 kg)  08/10/24 213 lb (96.6 kg)    BP 118/60   Pulse 87   Ht 5' 6 (1.676 m)   Wt 219 lb (99.3 kg)   LMP 06/15/2021 (Exact Date)   SpO2 98%   BMI 35.35 kg/m   Assessment and Plan:  Problem List Items Addressed This Visit       Unprioritized   Arthralgia of hip (Left)   Takes Celebrex  1-2 times per day. 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      Relevant Medications   celecoxib  (CELEBREX ) 100 MG capsule   Hyperlipidemia, mixed   Managed with diet only. Lab Results  Component Value Date   LDLCALC 146 (H) 09/11/2023         Relevant Orders   Lipid panel   TSH   Menopause syndrome   Doing well on HRT - no hot flashes or sweats, mood stable, no vaginal bleeding  Will continue current therapy.      Relevant Medications   estradiol  (ESTRACE ) 0.5 MG tablet   medroxyPROGESTERone  (PROVERA ) 2.5 MG tablet   Primary insomnia   Recommend trial of CBD gummies nightly.      Other Visit Diagnoses       Annual physical exam    -  Primary   declines Prevnar and Shingrix today  will give Tdap to update immunizations   Relevant Orders   CBC with Differential/Platelet   Comprehensive metabolic panel with GFR   Hemoglobin A1c   Lipid panel   TSH     Encounter for screening mammogram for breast cancer       mammo done earlier this month     Colon cancer screening       colonoscopy done this september     Encounter for long-term (current) use of NSAIDs       Relevant Orders   CBC with Differential/Platelet   Comprehensive metabolic panel with GFR     Screening for diabetes mellitus       Relevant Orders   Hemoglobin A1c     Encounter for immunization       Relevant Orders   Flu vaccine HIGH DOSE PF(Fluzone Trivalent) (Completed)       Return in about 1  year (around 11/03/2025) for CPX Dr. Lemon.    Leita HILARIO Adie, MD [facility] [place]

## 2024-11-03 NOTE — Assessment & Plan Note (Signed)
 Recommend trial of CBD gummies nightly.

## 2024-11-03 NOTE — Assessment & Plan Note (Signed)
Recent mammogram normal

## 2024-11-04 ENCOUNTER — Ambulatory Visit: Payer: Self-pay | Admitting: Internal Medicine

## 2024-11-04 LAB — COMPREHENSIVE METABOLIC PANEL WITH GFR
ALT: 39 IU/L — ABNORMAL HIGH (ref 0–32)
AST: 50 IU/L — ABNORMAL HIGH (ref 0–40)
Albumin: 4.4 g/dL (ref 3.8–4.9)
Alkaline Phosphatase: 69 IU/L (ref 49–135)
BUN/Creatinine Ratio: 39 — ABNORMAL HIGH (ref 9–23)
BUN: 27 mg/dL — ABNORMAL HIGH (ref 6–24)
Bilirubin Total: <0.2 mg/dL (ref 0.0–1.2)
CO2: 22 mmol/L (ref 20–29)
Calcium: 9.8 mg/dL (ref 8.7–10.2)
Chloride: 104 mmol/L (ref 96–106)
Creatinine, Ser: 0.7 mg/dL (ref 0.57–1.00)
Globulin, Total: 2.4 g/dL (ref 1.5–4.5)
Glucose: 113 mg/dL — ABNORMAL HIGH (ref 70–99)
Potassium: 5 mmol/L (ref 3.5–5.2)
Sodium: 140 mmol/L (ref 134–144)
Total Protein: 6.8 g/dL (ref 6.0–8.5)
eGFR: 103 mL/min/1.73 (ref >59)

## 2024-11-04 LAB — CBC WITH DIFFERENTIAL/PLATELET
Basophils Absolute: 0 x10E3/uL (ref 0.0–0.2)
Basos: 1 %
EOS (ABSOLUTE): 0.1 x10E3/uL (ref 0.0–0.4)
Eos: 2 %
Hematocrit: 43.2 % (ref 34.0–46.6)
Hemoglobin: 14.1 g/dL (ref 11.1–15.9)
Immature Grans (Abs): 0 x10E3/uL (ref 0.0–0.1)
Immature Granulocytes: 0 %
Lymphocytes Absolute: 1.9 x10E3/uL (ref 0.7–3.1)
Lymphs: 31 %
MCH: 29.6 pg (ref 26.6–33.0)
MCHC: 32.6 g/dL (ref 31.5–35.7)
MCV: 91 fL (ref 79–97)
Monocytes Absolute: 0.6 x10E3/uL (ref 0.1–0.9)
Monocytes: 10 %
Neutrophils Absolute: 3.5 x10E3/uL (ref 1.4–7.0)
Neutrophils: 55 %
Platelets: 345 x10E3/uL (ref 150–450)
RBC: 4.76 x10E6/uL (ref 3.77–5.28)
RDW: 12.3 % (ref 11.7–15.4)
WBC: 6.2 x10E3/uL (ref 3.4–10.8)

## 2024-11-04 LAB — TSH: TSH: 1.42 u[IU]/mL (ref 0.450–4.500)

## 2024-11-04 LAB — LIPID PANEL
Chol/HDL Ratio: 3.8 ratio (ref 0.0–4.4)
Cholesterol, Total: 192 mg/dL (ref 100–199)
HDL: 51 mg/dL (ref >39)
LDL Chol Calc (NIH): 123 mg/dL — ABNORMAL HIGH (ref 0–99)
Triglycerides: 99 mg/dL (ref 0–149)
VLDL Cholesterol Cal: 18 mg/dL (ref 5–40)

## 2024-11-04 LAB — HEMOGLOBIN A1C
Est. average glucose Bld gHb Est-mCnc: 117 mg/dL
Hgb A1c MFr Bld: 5.7 % — ABNORMAL HIGH (ref 4.8–5.6)

## 2025-11-04 ENCOUNTER — Encounter: Admitting: Student
